# Patient Record
Sex: Female | Born: 1990 | Hispanic: Yes | Marital: Married | Smoking: Never smoker
Health system: Southern US, Community
[De-identification: ages and names within clinical notes are randomized; demographics above are authoritative.]

## PROBLEM LIST (undated history)

## (undated) HISTORY — PX: OTHER SURGICAL HISTORY: SHX169

---

## 2009-09-23 ENCOUNTER — Emergency Department: Payer: Self-pay | Admitting: Emergency Medicine

## 2010-04-24 ENCOUNTER — Emergency Department: Payer: Self-pay | Admitting: Emergency Medicine

## 2010-05-04 ENCOUNTER — Inpatient Hospital Stay: Payer: Self-pay | Admitting: Obstetrics and Gynecology

## 2011-03-06 ENCOUNTER — Emergency Department: Payer: Self-pay | Admitting: Unknown Physician Specialty

## 2011-05-26 ENCOUNTER — Emergency Department: Payer: Self-pay | Admitting: *Deleted

## 2011-05-26 LAB — URINALYSIS, COMPLETE
Bilirubin,UR: NEGATIVE
Blood: NEGATIVE
Ketone: NEGATIVE
Ph: 5 (ref 4.5–8.0)
Protein: NEGATIVE
RBC,UR: 2 /HPF (ref 0–5)
Specific Gravity: 1.017 (ref 1.003–1.030)
Squamous Epithelial: 7

## 2011-05-26 LAB — CBC
HCT: 42.4 % (ref 35.0–47.0)
HGB: 14.5 g/dL (ref 12.0–16.0)
MCH: 28.9 pg (ref 26.0–34.0)
MCHC: 34.1 g/dL (ref 32.0–36.0)
RDW: 13.8 % (ref 11.5–14.5)
WBC: 11.5 10*3/uL — ABNORMAL HIGH (ref 3.6–11.0)

## 2011-05-26 LAB — PREGNANCY, URINE: Pregnancy Test, Urine: NEGATIVE m[IU]/mL

## 2011-05-26 LAB — COMPREHENSIVE METABOLIC PANEL
Albumin: 3.9 g/dL (ref 3.4–5.0)
Anion Gap: 10 (ref 7–16)
BUN: 8 mg/dL (ref 7–18)
Creatinine: 0.73 mg/dL (ref 0.60–1.30)
Glucose: 136 mg/dL — ABNORMAL HIGH (ref 65–99)
Osmolality: 282 (ref 275–301)
Potassium: 3.5 mmol/L (ref 3.5–5.1)
Sodium: 141 mmol/L (ref 136–145)
Total Protein: 8 g/dL (ref 6.4–8.2)

## 2011-06-04 ENCOUNTER — Inpatient Hospital Stay: Payer: Self-pay | Admitting: Surgery

## 2011-06-04 LAB — COMPREHENSIVE METABOLIC PANEL
Albumin: 4 g/dL (ref 3.4–5.0)
Anion Gap: 11 (ref 7–16)
BUN: 16 mg/dL (ref 7–18)
Creatinine: 0.62 mg/dL (ref 0.60–1.30)
EGFR (Non-African Amer.): >60
Glucose: 85 mg/dL (ref 65–99)
Osmolality: 287 (ref 275–301)
Potassium: 3.6 mmol/L (ref 3.5–5.1)
Sodium: 144 mmol/L (ref 136–145)
Total Protein: 8.4 g/dL — ABNORMAL HIGH (ref 6.4–8.2)

## 2011-06-04 LAB — URINALYSIS, COMPLETE
Bilirubin,UR: NEGATIVE
Blood: NEGATIVE
Ketone: NEGATIVE
Nitrite: NEGATIVE
Ph: 5 (ref 4.5–8.0)
Protein: NEGATIVE
Specific Gravity: 1.024 (ref 1.003–1.030)
WBC UR: 5 /HPF (ref 0–5)

## 2011-06-04 LAB — PREGNANCY, URINE: Pregnancy Test, Urine: NEGATIVE m[IU]/mL

## 2011-06-04 LAB — MAGNESIUM: Magnesium: 2 mg/dL

## 2011-06-04 LAB — CBC
HCT: 42.8 % (ref 35.0–47.0)
HGB: 14.5 g/dL (ref 12.0–16.0)
MCH: 29.1 pg (ref 26.0–34.0)
MCHC: 33.9 g/dL (ref 32.0–36.0)
MCV: 86 fL (ref 80–100)
RDW: 13.5 % (ref 11.5–14.5)

## 2011-06-06 LAB — BASIC METABOLIC PANEL
Anion Gap: 10 (ref 7–16)
Calcium, Total: 8.4 mg/dL — ABNORMAL LOW (ref 8.5–10.1)
Co2: 26 mmol/L (ref 21–32)
Creatinine: 0.62 mg/dL (ref 0.60–1.30)
EGFR (African American): >60
EGFR (Non-African Amer.): >60
Glucose: 102 mg/dL — ABNORMAL HIGH (ref 65–99)
Sodium: 142 mmol/L (ref 136–145)

## 2011-06-06 LAB — CBC WITH DIFFERENTIAL/PLATELET
Basophil #: 0 10*3/uL (ref 0.0–0.1)
Basophil %: 0.4 %
Eosinophil #: 0.5 10*3/uL (ref 0.0–0.7)
Eosinophil %: 8.4 %
HGB: 12.4 g/dL (ref 12.0–16.0)
Lymphocyte #: 1.1 10*3/uL (ref 1.0–3.6)
MCH: 28.8 pg (ref 26.0–34.0)
MCHC: 33.6 g/dL (ref 32.0–36.0)
MCV: 86 fL (ref 80–100)
Monocyte #: 0.4 10*3/uL (ref 0.0–0.7)
Monocyte %: 6.6 %
Neutrophil %: 66.8 %
Platelet: 242 10*3/uL (ref 150–440)
RBC: 4.3 10*6/uL (ref 3.80–5.20)
WBC: 6.2 10*3/uL (ref 3.6–11.0)

## 2011-06-06 LAB — HEPATIC FUNCTION PANEL A (ARMC)
Alkaline Phosphatase: 126 U/L (ref 50–136)
Bilirubin,Total: 0.6 mg/dL (ref 0.2–1.0)
SGPT (ALT): 393 U/L — ABNORMAL HIGH
Total Protein: 6.9 g/dL (ref 6.4–8.2)

## 2012-08-09 ENCOUNTER — Encounter: Payer: Self-pay | Admitting: Maternal and Fetal Medicine

## 2012-08-22 ENCOUNTER — Emergency Department: Payer: Self-pay | Admitting: Emergency Medicine

## 2012-08-22 LAB — URINALYSIS, COMPLETE
Bilirubin,UR: NEGATIVE
Ketone: NEGATIVE
Nitrite: NEGATIVE
Ph: 5 (ref 4.5–8.0)
RBC,UR: 4 /HPF (ref 0–5)
WBC UR: 24 /HPF (ref 0–5)

## 2012-08-22 LAB — COMPREHENSIVE METABOLIC PANEL
Alkaline Phosphatase: 89 U/L (ref 50–136)
Bilirubin,Total: 0.3 mg/dL (ref 0.2–1.0)
Calcium, Total: 8.1 mg/dL — ABNORMAL LOW (ref 8.5–10.1)
Chloride: 106 mmol/L (ref 98–107)
Co2: 24 mmol/L (ref 21–32)
Glucose: 90 mg/dL (ref 65–99)
Osmolality: 275 (ref 275–301)
SGOT(AST): 21 U/L (ref 15–37)
Sodium: 139 mmol/L (ref 136–145)
Total Protein: 7.4 g/dL (ref 6.4–8.2)

## 2012-08-22 LAB — CBC
HCT: 37.3 % (ref 35.0–47.0)
HGB: 12.8 g/dL (ref 12.0–16.0)
MCH: 28.6 pg (ref 26.0–34.0)
WBC: 8.7 10*3/uL (ref 3.6–11.0)

## 2012-08-22 LAB — LIPASE, BLOOD: Lipase: 93 U/L (ref 73–393)

## 2012-08-22 LAB — HCG, QUANTITATIVE, PREGNANCY: Beta Hcg, Quant.: 14845 m[IU]/mL — ABNORMAL HIGH

## 2012-08-23 ENCOUNTER — Encounter: Payer: Self-pay | Admitting: Obstetrics and Gynecology

## 2012-08-24 LAB — URINE CULTURE

## 2012-11-03 IMAGING — CR DG CHOLANGIOGRAM OPERATIVE
2 series · 11 of 11 positions shown · non-contrast
Comparison: none

REASON FOR EXAM: cholelithiasis
COMMENTS:

PROCEDURE:     DXR - DXR CHOLANGIOGRAM OP (INITIAL)  - June 05, 2011  [DATE]
RESULT:     Contrast is visualized in the hepatic ducts and common duct. No
retained stone is seen. Contrast is noted to flow into the duodenum without
evidence of obstruction.

[Series 4: cont. · 10 of 128 frames shown]
[frame 1/128]
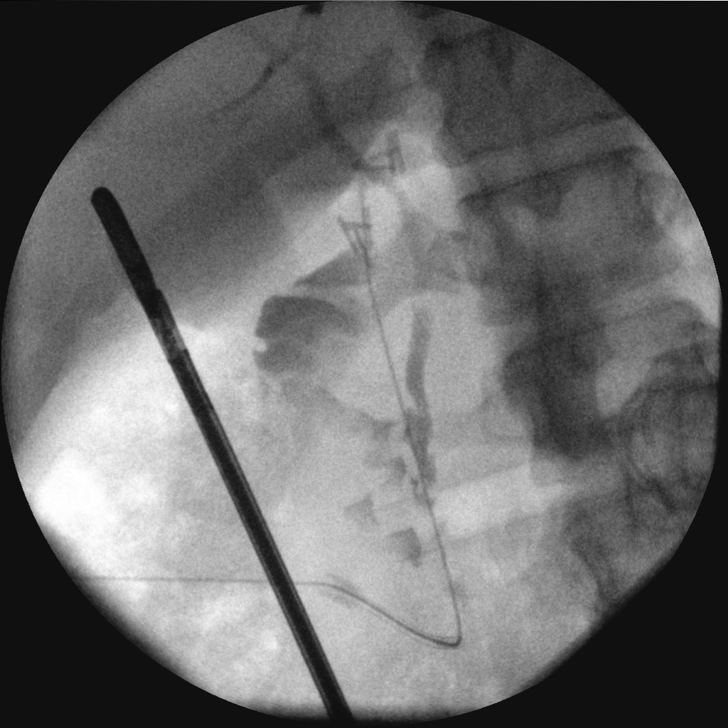
[frame 15/128]
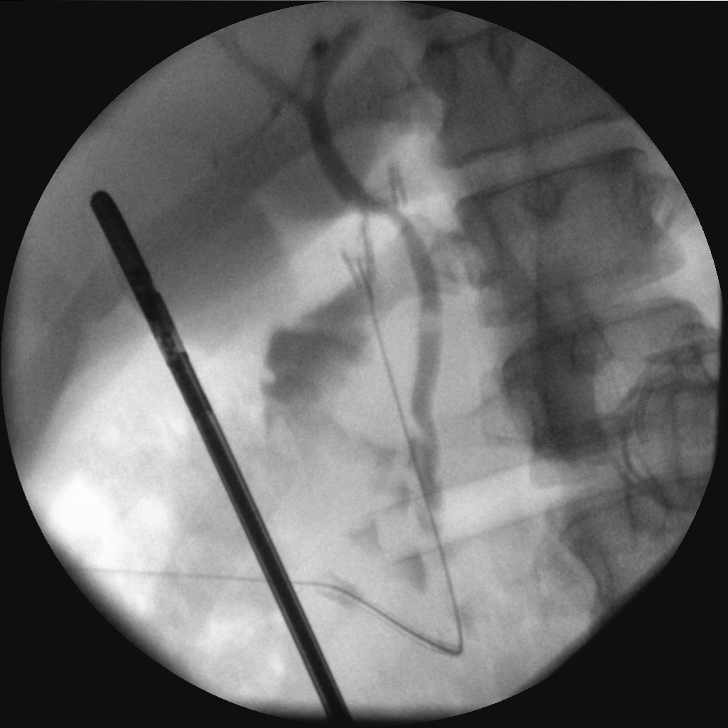
[frame 29/128]
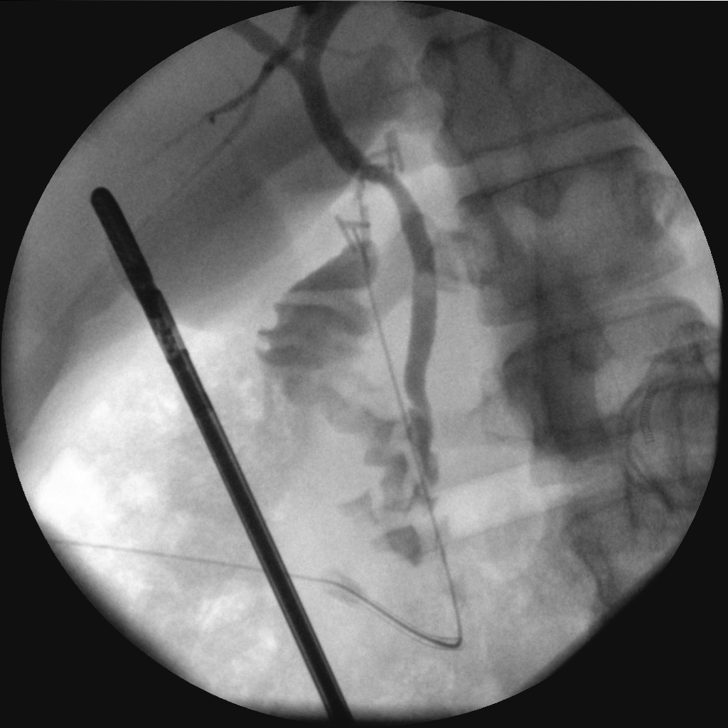
[frame 43/128]
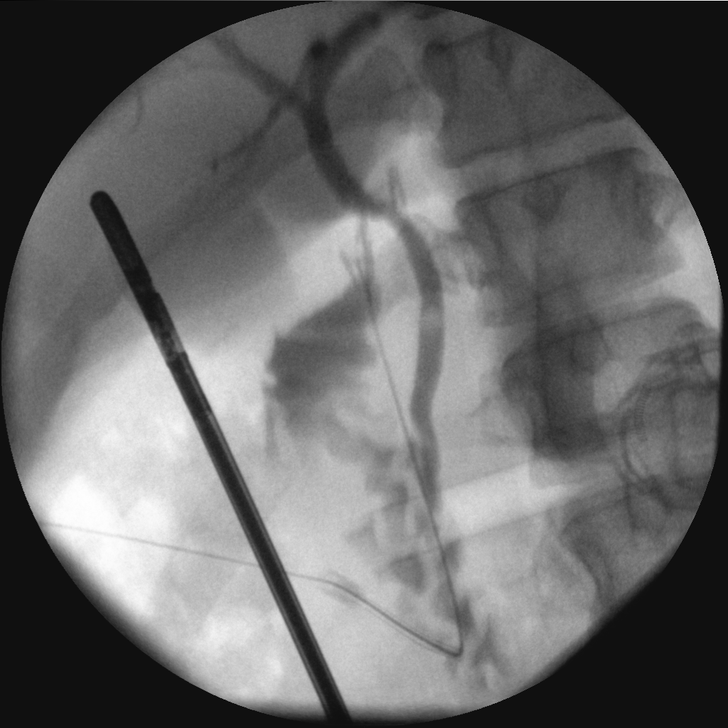
[frame 57/128]
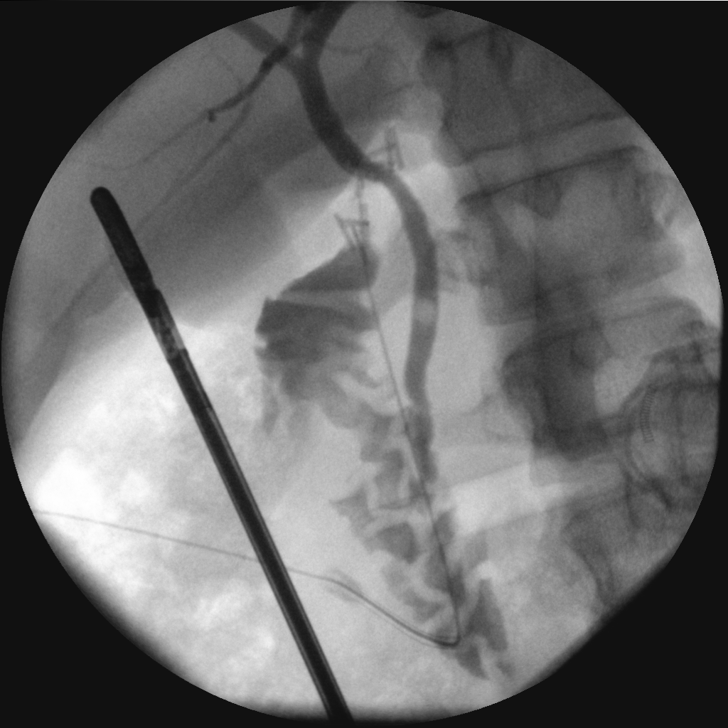
[frame 71/128]
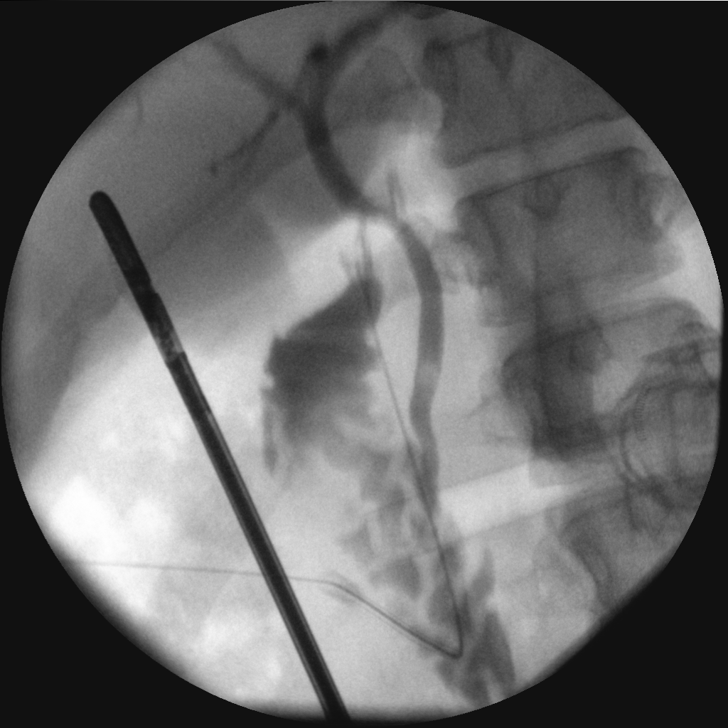
[frame 85/128]
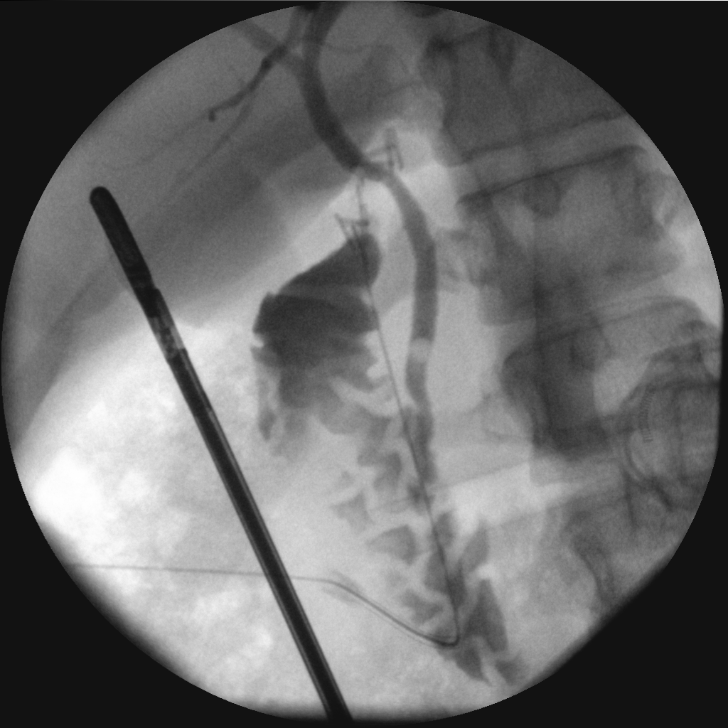
[frame 99/128]
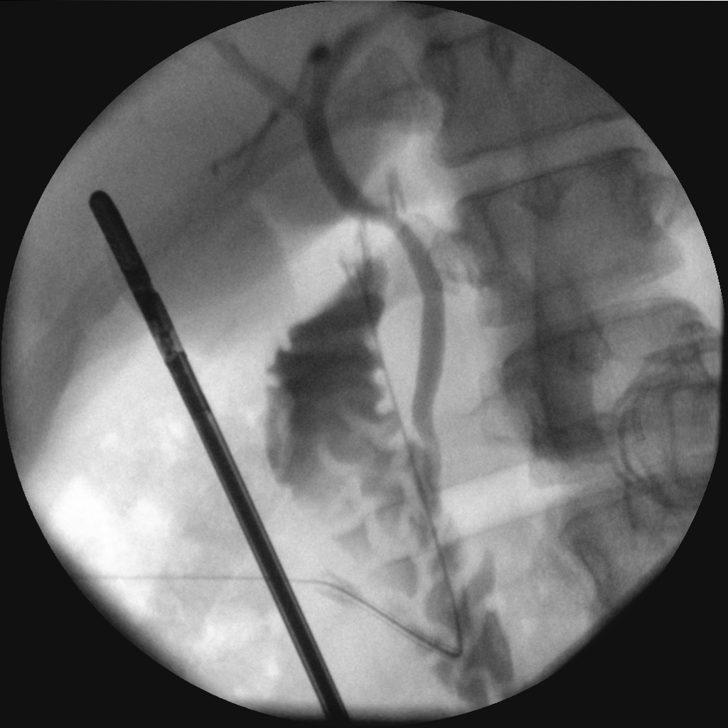
[frame 113/128]
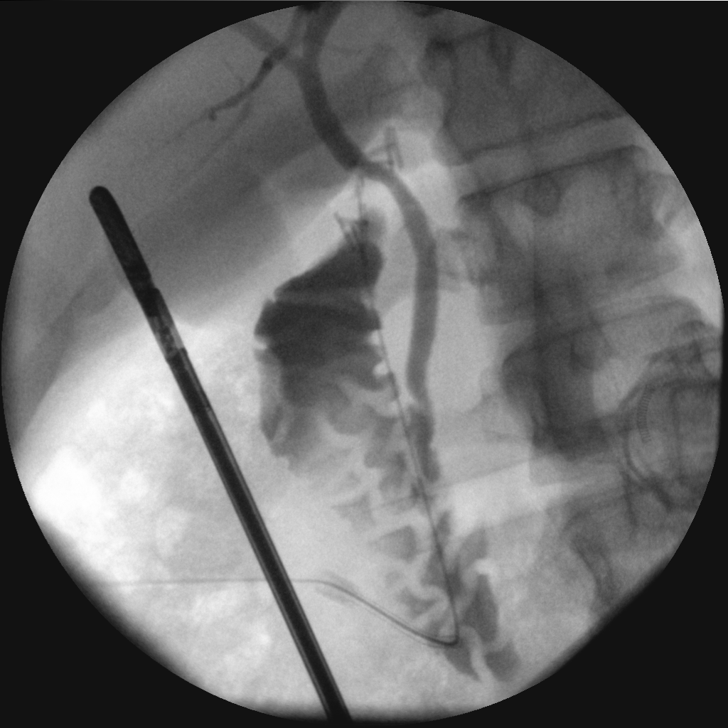
[frame 128/128]
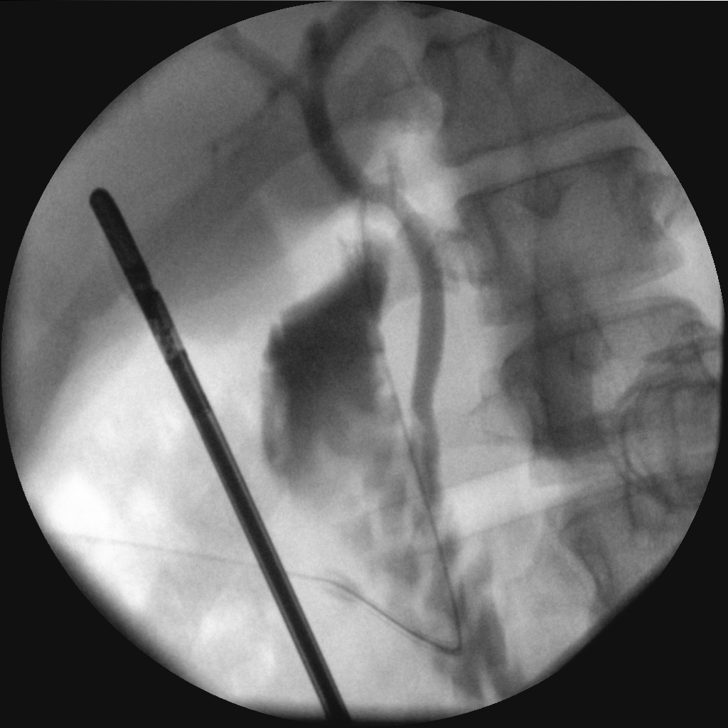

[chole]
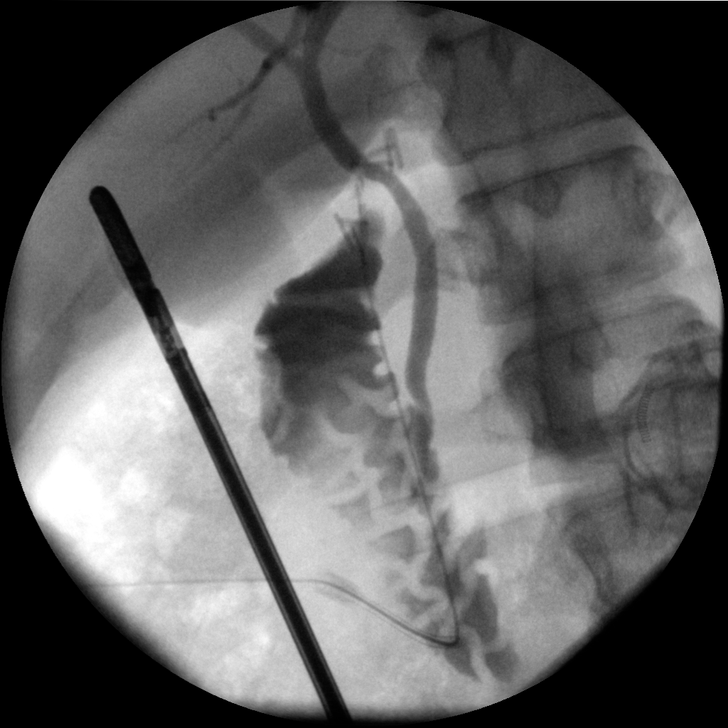

[11 of 11 positions shown; findings below may reference images not displayed]

IMPRESSION: Normal operative cholangiogram.

## 2012-11-24 ENCOUNTER — Ambulatory Visit: Payer: Self-pay | Admitting: Family Medicine

## 2013-01-13 ENCOUNTER — Observation Stay: Payer: Self-pay | Admitting: Obstetrics and Gynecology

## 2013-01-16 ENCOUNTER — Inpatient Hospital Stay: Payer: Self-pay

## 2013-01-16 LAB — CBC WITH DIFFERENTIAL/PLATELET
Basophil #: 0.1 10*3/uL (ref 0.0–0.1)
Basophil %: 0.6 %
Eosinophil %: 1.3 %
HCT: 36.9 % (ref 35.0–47.0)
HGB: 12.3 g/dL (ref 12.0–16.0)
Lymphocyte #: 1.7 10*3/uL (ref 1.0–3.6)
Lymphocyte %: 15.7 %
MCHC: 33.4 g/dL (ref 32.0–36.0)
MCV: 79 fL — ABNORMAL LOW (ref 80–100)
Monocyte #: 0.8 x10 3/mm (ref 0.2–0.9)
Monocyte %: 7.3 %
Neutrophil #: 8.4 10*3/uL — ABNORMAL HIGH (ref 1.4–6.5)
Neutrophil %: 75.1 %
RBC: 4.68 10*6/uL (ref 3.80–5.20)
RDW: 16 % — ABNORMAL HIGH (ref 11.5–14.5)
WBC: 11.1 10*3/uL — ABNORMAL HIGH (ref 3.6–11.0)

## 2013-01-17 LAB — PROTEIN / CREATININE RATIO, URINE: Creatinine, Urine: 217.4 mg/dL — ABNORMAL HIGH (ref 30.0–125.0)

## 2014-04-25 IMAGING — US US OB LIMITED
1 series · 14 of 28 positions shown · non-contrast
Comparison: none

REASON FOR EXAM: presentation
COMMENTS:

[Series 1: us ob limited · 0.35mm/px · 14 of 30 slices shown]
[im 2/30]
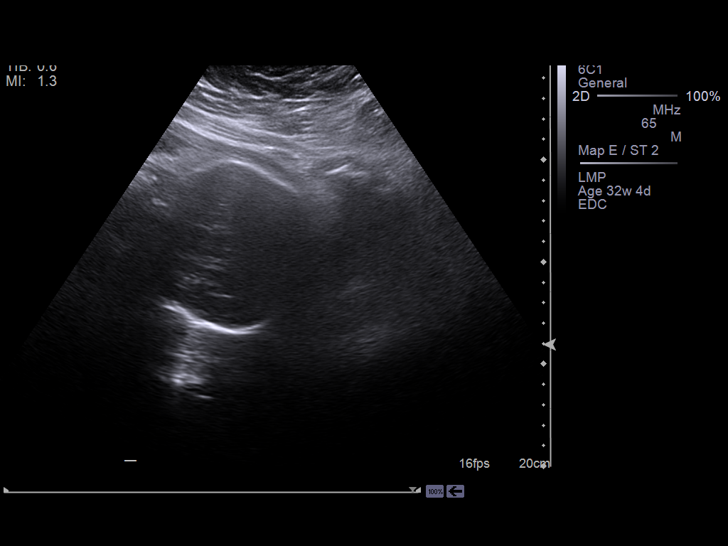
[im 4/30]
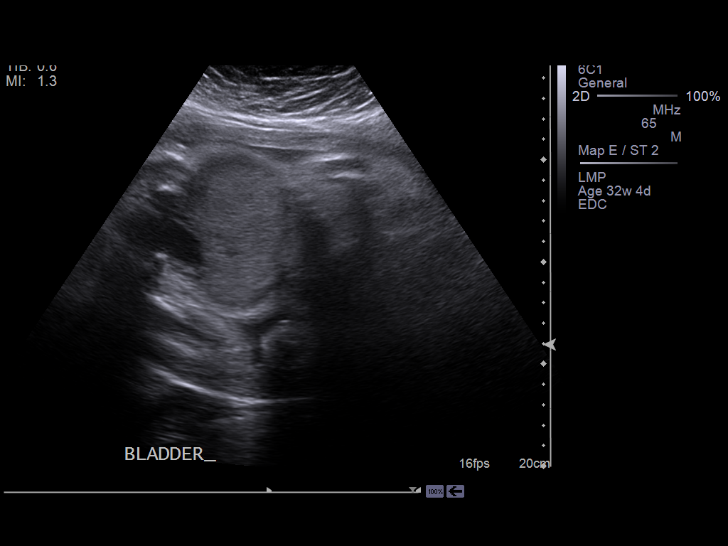
[im 6/30]
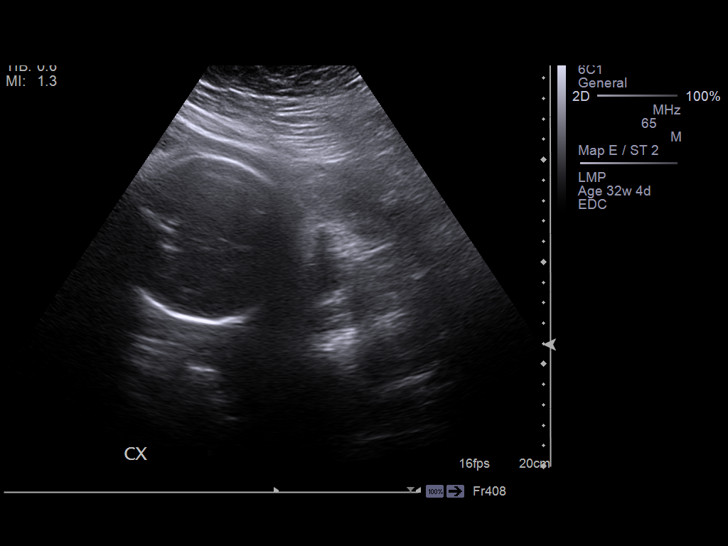
[im 8/30]
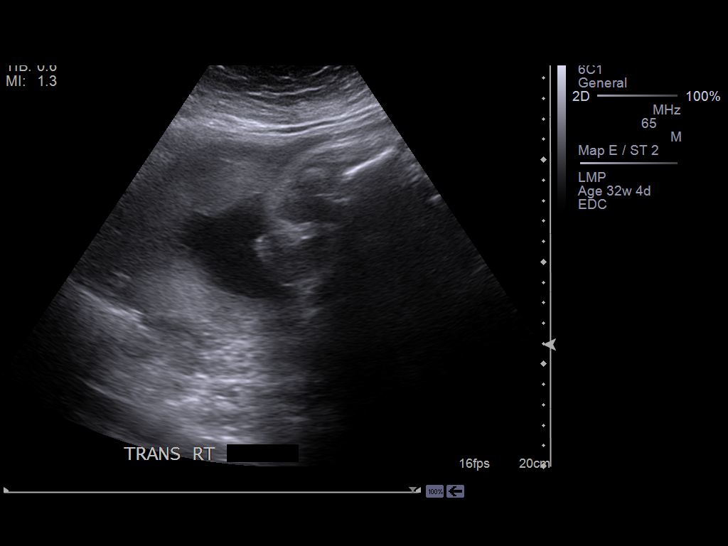
[im 10/30]
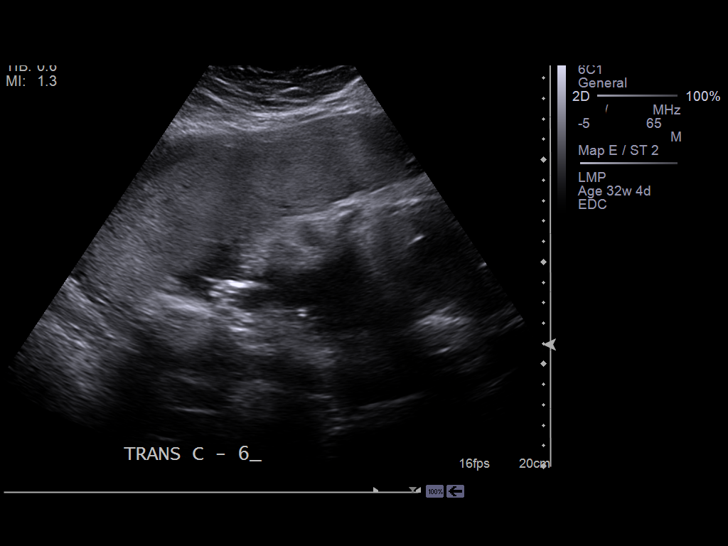
[im 12/30]
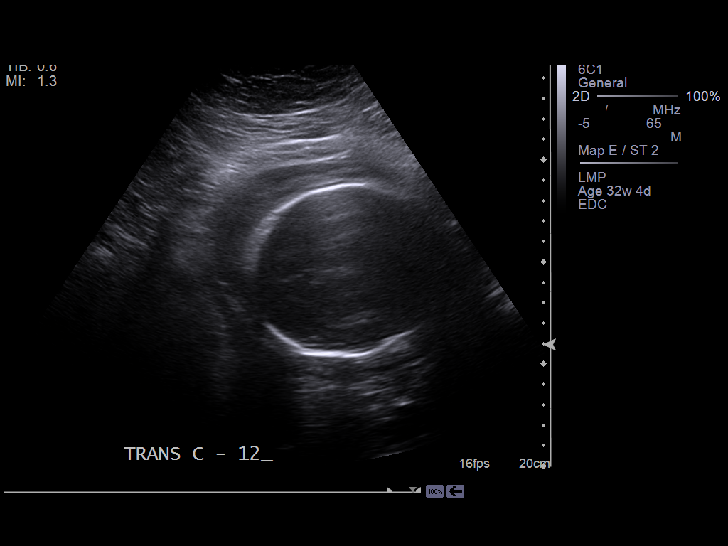
[im 14/30]
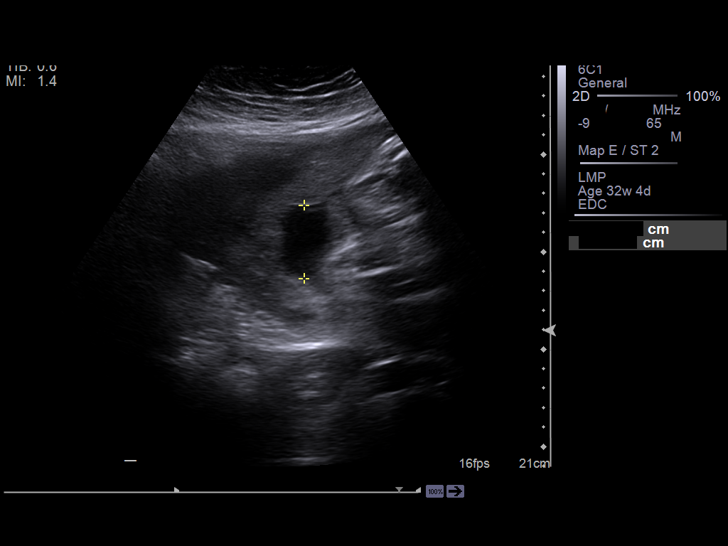
[im 17/30]
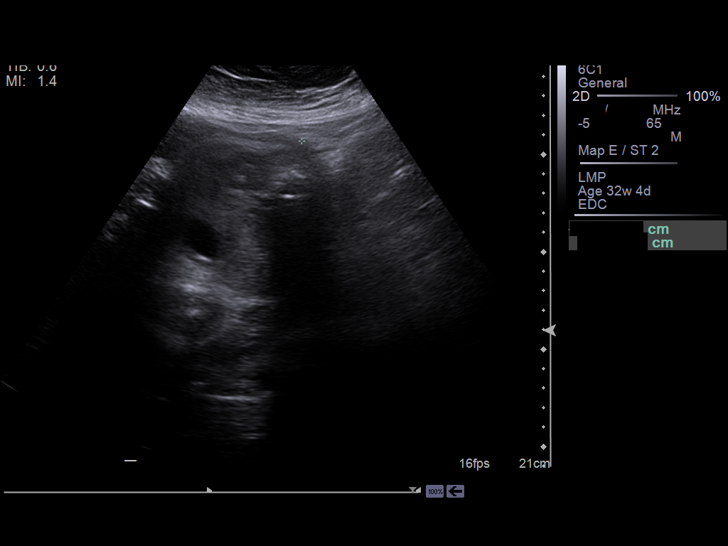
[im 19/30]
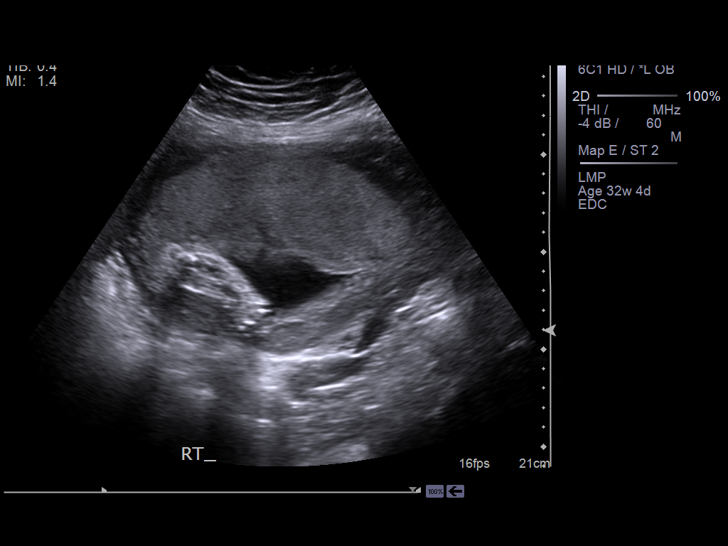
[im 21/30]
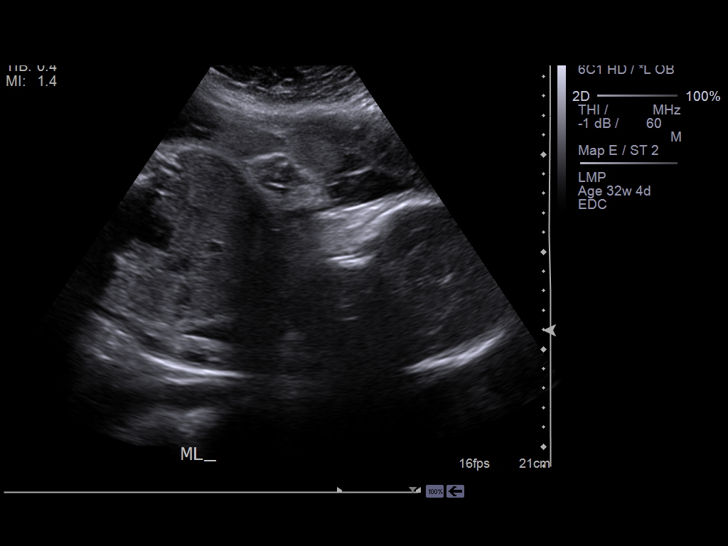
[im 23/30]
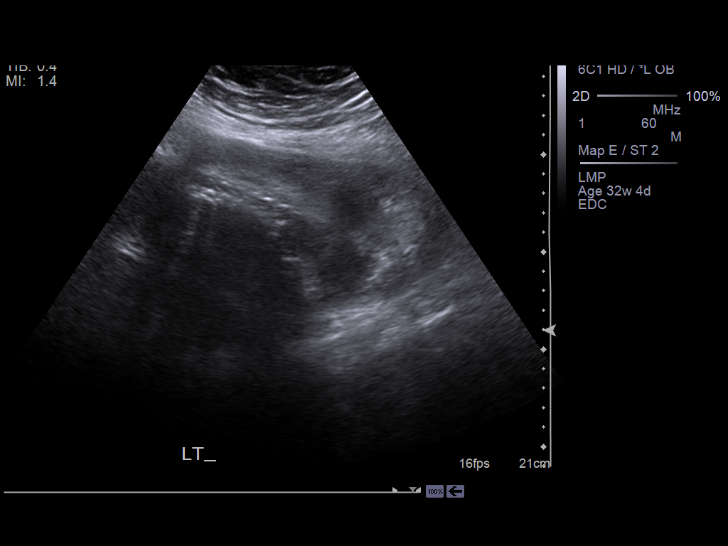
[im 25/30]
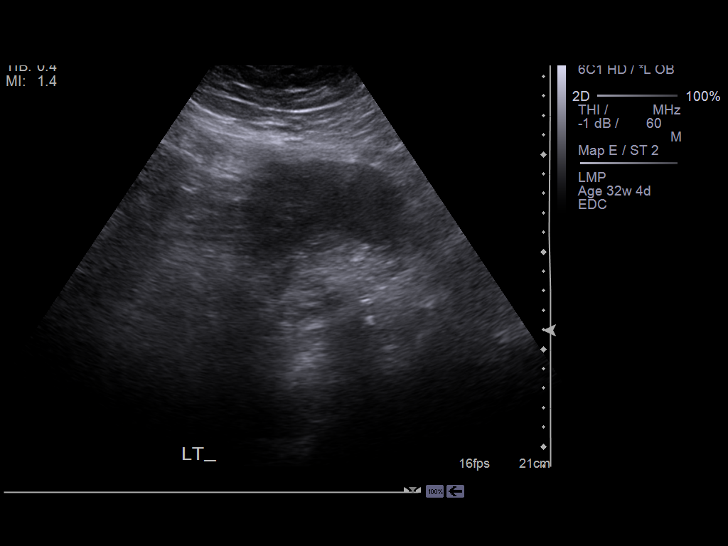
[im 27/30]
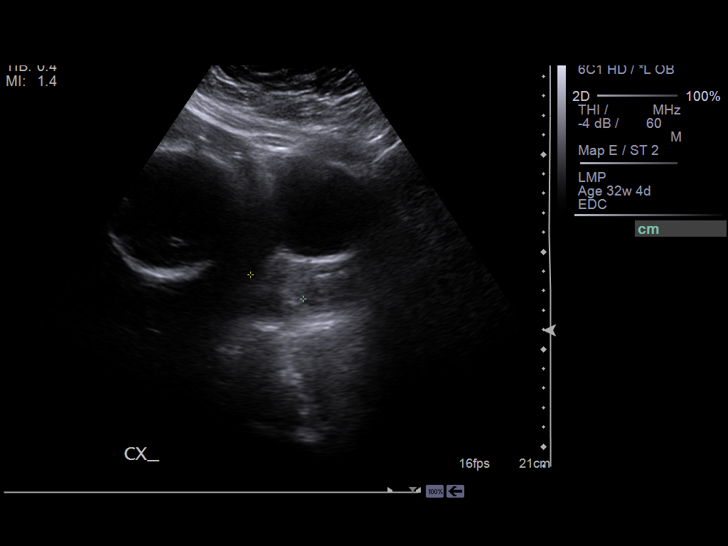
[im 30/30]
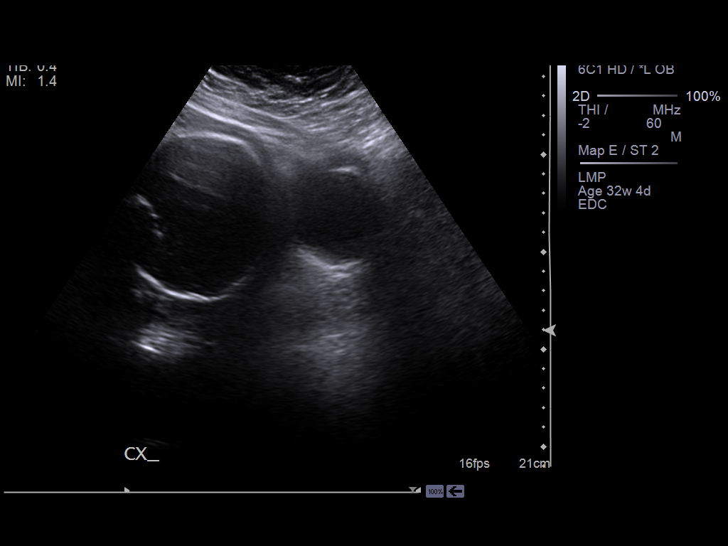

[14 of 28 positions shown; findings below may reference images not displayed]

PROCEDURE:     US  - US LIMITED OB  - November 24, 2012  [DATE]

RESULT:     Limited OB ultrasound is demonstrates a single intrauterine
gestation in a cephalic presentation with an AFI of 12.9 cm. The placenta is
fundal until the right. The cervix is closed with a length of 4.3 cm
measured. Fetal cardiac activity is present with a heart rate of 128 beats
per minute. Distance from the lower margin of the placenta to the internal
cervical os is 20.0 cm.
IMPRESSION: 1. Single intrauterine gestation in a cephalic presentation as described.
AFI is 12.9 cm.

[REDACTED]

## 2014-08-20 NOTE — H&P (Signed)
PATIENT NAME:  Erika Maxwell, Erika Maxwell MR#:  [id number] DATE OF BIRTH:  1990-12-15  DATE OF ADMISSION:  06/04/2011  PRIMARY CARE PHYSICIAN: None.   ADMITTING PHYSICIAN: Dr. Michela PitcherEly   CHIEF COMPLAINT: Abdominal pain.   BRIEF HISTORY: [name] is a 24 year old woman seen in the Emergency Room for the third time in the last six months with midepigastric, right upper quadrant, right back, and right shoulder pain. Work-up on two previous occasions has identified biliary tract disease as the source of her discomfort. She has had two ultrasounds both of which demonstrated multiple stones without evidence of cholecystitis. She was seen in our office by Dr. Excell Seltzerooper but she declined surgical intervention at the time of that evaluation liking to try nonsurgical techniques for improvement of her symptoms. This afternoon she developed severe pain in the midepigastric area which radiated through to her back and this is the worst episode that she has had since the onset of these symptoms over six months ago. She was last here the end of January and was set up to see Dr. Excell Seltzerooper again in the office next week.   These symptoms are associated with profound nausea, multiple episodes of vomiting with no fever or chills. She has no change in her bowel habits. She denies any other significant history of GI problems including hepatitis, yellow jaundice, pancreatitis, peptic ulcer disease, previous diagnosis of gallbladder disease, or diverticulitis. She has no history of abdominal surgery. She is a year from vaginal delivery with a tear for a normal child. She did not have any complications from that injury. She denies any history of cardiac disease, hypertension, or diabetes.   SOCIAL HISTORY: She does not smoke or drink significant alcohol.   REVIEW OF SYSTEMS: 10 point review of systems was undertaken with the patient and is unremarkable. The interview was carried out through the hospital interpreter.   PHYSICAL  EXAMINATION:   VITALS: Blood pressure 118/63, heart rate 82 and regular, temperature is 98.4.   HEENT: No scleral icterus. Pupils were equally round and there is no evidence of facial deformity.   NECK: Supple without adenopathy and her trachea is midline.   CHEST: Clear with no adventitious sounds and she has normal pulmonary excursion.   CARDIAC: No murmurs or gallops to my ear and seems to be in normal sinus rhythm.   ABDOMEN: Her abdomen is soft with no abdominal tenderness at this time. She has received IV narcotics. She has no point tenderness. No rebound. No guarding. No masses. No hernias noted. She does have active bowel sounds.   LOWER EXTREMITIES: Full range of motion. No deformities. Good distal pulses.   PSYCHIATRIC: Normal orientation and normal affect.   LABORATORY, DIAGNOSTIC, AND RADIOLOGICAL DATA: Blood work on this admission reveals normal white blood cell count and normal lipase. Liver function studies reveal bilirubin is elevated to 1.8 where it was 1.9 on a previous evaluation. Alkaline phosphatase is slightly elevated at 138. SGPT and SGOT are markedly elevated over their previous numbers. We have not repeated an ultrasound at this time.   IMPRESSION: This woman appears to present with biliary colic, possible cholecystitis. This is her third episode and she is significantly uncomfortable. Will admit her to the hospital. Place her on IV antibiotics and pain control medications. Will anticipate surgical intervention tomorrow. This plan has been outlined to the patient through the hospital interpreter and she is in agreement. Risks, benefits, and options have been outlined and accepted.   ____________________________ Quentin Orealph L. Ely  III, MD rle:drc D: 06/04/2011 23:48:46 ET T: 06/05/2011 06:35:47 ET JOB#: [id number]  cc: Quentin Ore III, MD, <Dictator> Quentin Ore MD ELECTRONICALLY SIGNED 06/08/2011 21:14

## 2014-08-20 NOTE — Op Note (Signed)
PATIENT NAME:  Erika Maxwell Erika Maxwell, Erika Maxwell MR#:  [id number] DATE OF BIRTH:  1991-03-09  DATE OF PROCEDURE:  06/05/2011  PREOPERATIVE DIAGNOSIS: Choledocholithiasis.   POSTOPERATIVE DIAGNOSIS: Choledocholithiasis.   PROCEDURE: Laparoscopic cholecystectomy with C-arm fluoroscopic cholangiography.   SURGEON: Tanmay Halteman E. Excell Seltzerooper, MD   ANESTHESIA: General with endotracheal tube.   INDICATIONS: This is a patient with elevated liver function tests and known gallstones with a work-up showing a probable choledocholithiasis. Preoperatively we discussed rationale for surgery, the options of observation, risks of bleeding, infection, recurrence, and cosmetic deformity as well as risk of opening to a laparotomy and the risk of bile duct damage, bile duct leak, retained common bile duct stone, any of which could require further surgery and/or ERCP, stent, and papillotomy. This has been reviewed for her personally in my office several months ago as well.   FINDINGS: C-arm fluoroscopic cholangiography demonstrated good flow in the duodenum. There was initially an air bubble that moved and disappeared, went through into the duodenum with fluoroscopy. No other intraluminal filling defects. Proximal ducts were identified and the cystic duct had been cannulated.   DESCRIPTION OF PROCEDURE: The patient was induced to general anesthesia, given IV antibiotics, prepped and draped in a sterile fashion. VTE prophylaxis was in place.   An incision was made after placing local anesthetic in the periumbilical area. A Veress needle was placed. Pneumoperitoneum was obtained. 5 mm trocar port was placed. The abdominal cavity was explored and under direct vision a 10 mm epigastric port and two lateral 5 mm ports were placed. The gallbladder was placed on tension. The peritoneum over the infundibulum was incised bluntly. The cystic duct gallbladder junction was well identified. Cystic lymphatics were doubly clipped and divided to allow  for visualization of the cystic duct. It was clipped and incised and through a separate incision an Angiocath cholangiogram catheter was placed. C-arm fluoroscopic cholangiography was performed and the above was noted. The cholangiogram catheter was then removed. Cystic duct was doubly clipped and divided and the cystic artery was doubly clipped and divided in two branches and then the gallbladder was taken from the gallbladder fossa with electrocautery. An arterial bleeder in the gallbladder fossa was doubly clipped and divided as well and the gallbladder was passed out through the epigastric port site with the aid of an EndoCatch bag. The area was checked for hemostasis and there was no sign of bleeding, bile leak or bowel injury. The camera was placed in the epigastric site to view back to the periumbilical site. There was no sign of bleeding, bowel injury, or adhesions; therefore, pneumoperitoneum was released. All ports were removed. Fascial edges at the epigastric site were approximated with multiple figure-of-eight 0 Vicryls and 4-0 subcuticular Monocryl was used on all skin edges. Steri-Strips, Mastisol, and sterile dressings were placed.   The patient tolerated the procedure well. There were no complications. She was taken to the recovery room in stable condition to be admitted for continued care.    ____________________________ Adah Salvageichard E. Excell Seltzerooper, MD rec:drc D: 06/05/2011 09:41:39 ET T: 06/05/2011 09:48:08 ET JOB#: [id number]  cc: Adah Salvageichard E. Excell Seltzerooper, MD, <Dictator> Lattie HawICHARD E Avantae Bither MD ELECTRONICALLY SIGNED 06/05/2011 13:38

## 2014-09-05 NOTE — H&P (Signed)
L&D Evaluation:  History:  HPI 24 y/o G2P1 here for post-dates induction   Patient's Medical History No Chronic Illness   Patient's Surgical History none   Medications Pre Natal Vitamins   Allergies NKDA   Social History none   Family History Non-Contributory   ROS:  ROS All systems were reviewed.  HEENT, CNS, GI, GU, Respiratory, CV, Renal and Musculoskeletal systems were found to be normal.   Exam:  Vital Signs stable   General no apparent distress   Abdomen gravid, tender with contractions   Back no CVAT   Reflexes 1+   Pelvic now 8cm   FHT normal rate with no decels   Ucx regular   Impression:  Impression active labor, cervidil pulled at 4am   Plan:  Comments epidural placed antic svd   Electronic Signatures: Margaretha GlassingEvans, Ricky L (MD)  (Signed 22-Sep-14 06:41)  Authored: L&D Evaluation   Last Updated: 22-Sep-14 06:41 by Margaretha GlassingEvans, Ricky L (MD)

## 2014-09-05 NOTE — H&P (Signed)
L&D Evaluation:  History:  HPI [age] y/o G2P1 at 39+ weeks EDC 01/15/13 Pregnancy complicated by obesity and + Down's risk on AFP   Presents with for NST   Exam:  FHT normal rate with no decels   Ucx regular   Plan:  Plan reactive NST   Comments Induction scheduled for 9/21   Electronic Signatures: Margaretha GlassingEvans, Ricky L (MD)  (Signed 18-Sep-14 09:27)  Authored: L&D Evaluation   Last Updated: 18-Sep-14 09:27 by Margaretha GlassingEvans, Ricky L (MD)

## 2015-07-09 LAB — HM PAP SMEAR: HM Pap smear: NEGATIVE

## 2015-11-12 ENCOUNTER — Other Ambulatory Visit: Payer: Self-pay | Admitting: Family Medicine

## 2015-11-12 DIAGNOSIS — Z3401 Encounter for supervision of normal first pregnancy, first trimester: Secondary | ICD-10-CM

## 2015-11-12 DIAGNOSIS — Z369 Encounter for antenatal screening, unspecified: Secondary | ICD-10-CM

## 2015-11-12 DIAGNOSIS — O99210 Obesity complicating pregnancy, unspecified trimester: Secondary | ICD-10-CM

## 2015-11-20 ENCOUNTER — Ambulatory Visit
Admission: RE | Admit: 2015-11-20 | Discharge: 2015-11-20 | Disposition: A | Discharge status: Home / Self Care | Payer: Self-pay | Source: Ambulatory Visit | Attending: Family Medicine | Admitting: Family Medicine

## 2015-11-20 DIAGNOSIS — Z3401 Encounter for supervision of normal first pregnancy, first trimester: Secondary | ICD-10-CM

## 2015-11-20 DIAGNOSIS — Z3A10 10 weeks gestation of pregnancy: Secondary | ICD-10-CM | POA: Insufficient documentation

## 2015-11-20 DIAGNOSIS — N83201 Unspecified ovarian cyst, right side: Secondary | ICD-10-CM | POA: Insufficient documentation

## 2015-11-20 DIAGNOSIS — O99211 Obesity complicating pregnancy, first trimester: Secondary | ICD-10-CM | POA: Insufficient documentation

## 2015-11-20 DIAGNOSIS — O99210 Obesity complicating pregnancy, unspecified trimester: Secondary | ICD-10-CM

## 2015-12-10 ENCOUNTER — Ambulatory Visit (HOSPITAL_BASED_OUTPATIENT_CLINIC_OR_DEPARTMENT_OTHER)
Admission: RE | Admit: 2015-12-10 | Discharge: 2015-12-10 | Disposition: A | Discharge status: Home / Self Care | Payer: Medicaid Other | Source: Ambulatory Visit | Attending: Maternal & Fetal Medicine | Admitting: Maternal & Fetal Medicine

## 2015-12-10 ENCOUNTER — Ambulatory Visit
Admission: RE | Admit: 2015-12-10 | Discharge: 2015-12-10 | Disposition: A | Discharge status: Home / Self Care | Payer: Medicaid Other | Source: Ambulatory Visit | Attending: Family Medicine | Admitting: Family Medicine

## 2015-12-10 DIAGNOSIS — Z369 Encounter for antenatal screening, unspecified: Secondary | ICD-10-CM

## 2015-12-10 DIAGNOSIS — N83291 Other ovarian cyst, right side: Secondary | ICD-10-CM | POA: Insufficient documentation

## 2015-12-10 DIAGNOSIS — Z36 Encounter for antenatal screening of mother: Secondary | ICD-10-CM | POA: Diagnosis not present

## 2015-12-10 DIAGNOSIS — Z3A12 12 weeks gestation of pregnancy: Secondary | ICD-10-CM | POA: Insufficient documentation

## 2015-12-10 DIAGNOSIS — Z3481 Encounter for supervision of other normal pregnancy, first trimester: Secondary | ICD-10-CM | POA: Insufficient documentation

## 2015-12-10 NOTE — Progress Notes (Signed)
[facility]  [facility] Length of Consultation: 45 minutes   Erika Maxwell  [facility] to review prenatal screening and testing options.  This note summarizes the information we discussed.    We offered the following routine screening tests for this pregnancy:  First trimester screening, which includes nuchal translucency ultrasound screen and first trimester maternal serum marker screening.  The nuchal translucency has approximately an 80% detection rate for Down syndrome and can be positive for other chromosome abnormalities as well as congenital heart defects.  When combined with a maternal serum marker screening, the detection rate is up to 90% for Down syndrome and up to 97% for trisomy 18.     Maternal serum marker screening, a blood test that measures pregnancy proteins, can provide risk assessments for Down syndrome, trisomy 18, and open neural tube defects (spina bifida, anencephaly). Because it does not directly examine the fetus, it cannot positively diagnose or rule out these problems.  Targeted ultrasound uses high frequency sound waves to create an image of the developing fetus.  An ultrasound is often recommended as a routine means of evaluating the pregnancy.  It is also used to screen for fetal anatomy problems (for example, a heart defect) that might be suggestive of a chromosomal or other abnormality.   Should these screening tests indicate an increased concern, then the following additional testing options would be offered:  The chorionic villus sampling procedure is available for first trimester chromosome analysis.  This involves the withdrawal of a small amount of chorionic villi (tissue from the developing placenta).  Risk of pregnancy loss is estimated to be approximately 1 in 200 to 1 in 100 (0.5 to 1%).  There is approximately a 1% (1 in 100) chance that the CVS chromosome results  will be unclear.  Chorionic villi cannot be tested for neural tube defects.     Amniocentesis involves the removal of a small amount of amniotic fluid from the sac surrounding the fetus with the use of a thin needle inserted through the maternal abdomen and uterus.  Ultrasound guidance is used throughout the procedure.  Fetal cells from amniotic fluid are directly evaluated and > 99.5% of chromosome problems and > 98% of open neural tube defects can be detected. This procedure is generally performed after the 15th week of pregnancy.  The main risks to this procedure include complications leading to miscarriage in less than 1 in 200 cases (0.5%).  As another option for information if the pregnancy is suspected to be an an increased chance for certain chromosome conditions, we also reviewed the availability of cell free fetal DNA testing from maternal blood to determine whether or not the baby may have either Down syndrome, trisomy 6613, or trisomy 6018.  This test utilizes a maternal blood sample and DNA sequencing technology to isolate circulating cell free fetal DNA from maternal plasma.  The fetal DNA can then be analyzed for DNA sequences that are derived from the three most common chromosomes involved in aneuploidy, chromosomes 13, 18, and 21.  If the overall amount of DNA is greater than the expected level for any of these chromosomes, aneuploidy is suspected.  While we do not consider it a replacement for invasive testing and karyotype analysis, a negative result from this testing would be reassuring, though not a guarantee of a normal chromosome complement for the baby.  An abnormal result is certainly suggestive of an abnormal chromosome  complement, though we would still recommend CVS or amniocentesis to confirm any findings from this testing.   Cystic Fibrosis and Spinal Muscular Atrophy (SMA) screening were also discussed with the patient. Both conditions are recessive, which means that both parents  must be carriers in order to have a child with the disease.  Cystic fibrosis (CF) is one of the most common genetic conditions in persons of Caucasian ancestry.  This condition occurs in approximately 1 in 2,500 Caucasian persons and results in thickened secretions in the lungs, digestive, and reproductive systems.  For a baby to be at risk for having CF, both of the parents must be carriers for this condition.  Approximately 1 in 5325 Caucasian persons is a carrier for CF.  Current carrier testing looks for the most common mutations in the gene for CF and can detect approximately 90% of carriers in the Caucasian population.  This means that the carrier screening can greatly reduce, but cannot eliminate, the chance for an individual to have a child with CF.  If an individual is found to be a carrier for CF, then carrier testing would be available for the partner. As part of [place] newborn screening profile, all babies born in the state of [place] will have a two-tier screening process.  Specimens are first tested to determine the concentration of immunoreactive trypsinogen (IRT).  The top 5% of specimens with the highest IRT values then undergo DNA testing using a panel of over 40 common CF mutations. SMA is a neurodegenerative disorder that leads to atrophy of skeletal muscle and overall weakness.  This condition is also more prevalent in the Caucasian population, with 1 in 40-1 in 60 persons being a carrier and 1 in 6,000-1 in 10,000 children being affected.  There are multiple forms of the disease, with some causing death in infancy to other forms with survival into adulthood.  The genetics of SMA is complex, but carrier screening can detect up to 95% of carriers in the Caucasian population.  Similar to CF, a negative result can greatly reduce, but cannot eliminate, the chance to have a child with SMA.  We obtained a detailed family history and pregnancy history.  The family history was reported  to be unremarkable for birth defects, mental retardation, recurrent pregnancy loss or known chromosome abnormalities.  [name] stated that this is her third pregnancy.  She and her husband have two healthy sons.  She reported no complications or exposures in this pregnancy that would be expected to increase the risk for birth defects.  After consideration of the options, Erika Maxwell elected to proceed with first trimester screening and to declined carrier testing for CF and SMA.  An ultrasound was performed at the time of the visit.  The gestational age was consistent with  12 weeks.  Fetal anatomy could not be assessed due to early gestational age.  Please refer to the ultrasound report for details of that study.  [name] was encouraged to call with questions or concerns.  We can be contacted at [phone].   Cherly Andersoneborah F. Roberth Berling, MS, CGC

## 2015-12-10 NOTE — Progress Notes (Signed)
Agree with assessment and plan as outlined in [facility] Wells's note.   

## 2015-12-13 ENCOUNTER — Telehealth: Payer: Self-pay | Admitting: Obstetrics and Gynecology

## 2015-12-13 NOTE — Telephone Encounter (Signed)
The results of the First Trimester Nuchal Translucency and Biochemical Screening are now available.  We spoke with [name] by phone with the aid of a Spanish interpreter.  These results showed an increased risk for Down Syndrome.  Specifically, the risk for Down syndrome is now 1 in 42186 (the prior risk based upon age alone was 1 in 50837).  The risk for Trisomy 13/18 is estimated to be 1 in 5,193.  As we discussed when we called with these results, if more definitive information is desired, we would offer chorionic villus sampling or amniocentesis.  Because we do not yet know the effectiveness of combined first and second trimester screening, we do not recommend a maternal serum screen to assess the chance for chromosome conditions.  However, if screening for neural tube defects is desired, maternal serum screening for AFP only can be performed between 15 and [redacted] weeks gestation.    We also reviewed the availability of cell free fetal DNA testing from maternal blood to determine whether or not the baby may have either Down syndrome, trisomy 6713, or trisomy 2718.  This test utilizes a maternal blood sample and DNA sequencing technology to isolate circulating cell free fetal DNA from maternal plasma.  The fetal DNA can then be analyzed for DNA sequences that are derived from the three most common chromosomes involved in aneuploidy, chromosomes 13, 18, and 21.  If the overall amount of DNA is greater than the expected level for any of these chromosomes, aneuploidy is suspected.  While we do not consider it a replacement for invasive testing and karyotype analysis, a negative result from this testing would be reassuring, though not a guarantee of a normal chromosome complement for the baby.  An abnormal result is certainly suggestive of an abnormal chromosome complement, though we would still recommend CVS or amniocentesis to confirm any findings from this testing.  The patient elected to return to our clinic  on Monday, September 18 and 9am for a detailed anatomy ultrasound and cell free fetal DNA testing.  She declined amniocentesis at this time.  The patient was encouraged to contact us with any questions.  We may be reached at [phone].  Erika Andersoneborah F. Neha Waight, MS, CGC

## 2016-01-10 ENCOUNTER — Other Ambulatory Visit: Payer: Self-pay

## 2016-01-10 DIAGNOSIS — Z3689 Encounter for other specified antenatal screening: Secondary | ICD-10-CM

## 2016-01-14 ENCOUNTER — Ambulatory Visit
Admission: RE | Admit: 2016-01-14 | Discharge: 2016-01-14 | Disposition: A | Discharge status: Home / Self Care | Payer: Medicaid Other | Source: Ambulatory Visit | Attending: Maternal and Fetal Medicine | Admitting: Maternal and Fetal Medicine

## 2016-01-14 DIAGNOSIS — Z36 Encounter for antenatal screening of mother: Secondary | ICD-10-CM | POA: Insufficient documentation

## 2016-01-14 DIAGNOSIS — O289 Unspecified abnormal findings on antenatal screening of mother: Secondary | ICD-10-CM

## 2016-01-14 DIAGNOSIS — Z369 Encounter for antenatal screening, unspecified: Secondary | ICD-10-CM

## 2016-01-14 DIAGNOSIS — Z3689 Encounter for other specified antenatal screening: Secondary | ICD-10-CM

## 2016-01-14 DIAGNOSIS — Z3A17 17 weeks gestation of pregnancy: Secondary | ICD-10-CM | POA: Insufficient documentation

## 2016-01-14 NOTE — Progress Notes (Signed)
I met with [name] today following her anatomy ultrasound to review the first trimester screening results and additional testing options.  With the aid of a Spanish interpreter, we reviewed the 1 in 186 chance for Down syndrome from those results as well as the options of ultrasound, amniocentesis and cell free DNA testing.  The patient desires ultrasound only and declined both cell free DNA testing and amniocentesis.  In addition, the PAPP-A level was low (0.5%ile) on first trimester screening.  Low PAPP-A has been associated with poor pregnancy outcomes including low birth weight, preterm delivery and preeclampsia.  We therefore recommend a follow up ultrasound for growth in the third trimester.  The patient also spoke with Dr. Ricardo Jericho regarding the results of her ultrasound and the anatomy that was suboptimally visualized today.  She will return to clinic for additional ultrasound imaging in 2-3 weeks.  Wilburt Finlay, MS, CGC

## 2016-02-04 ENCOUNTER — Ambulatory Visit
Admission: RE | Admit: 2016-02-04 | Discharge: 2016-02-04 | Disposition: A | Discharge status: Home / Self Care | Payer: Medicaid Other | Source: Ambulatory Visit | Attending: Maternal & Fetal Medicine | Admitting: Maternal & Fetal Medicine

## 2016-02-04 ENCOUNTER — Other Ambulatory Visit: Payer: Self-pay | Admitting: Maternal & Fetal Medicine

## 2016-02-04 VITALS — BP 131/69 | HR 88 | Temp 98.4°F | Resp 17 | Ht 64.0 in | Wt 278.0 lb

## 2016-02-04 DIAGNOSIS — IMO0002 Reserved for concepts with insufficient information to code with codable children: Secondary | ICD-10-CM

## 2016-02-04 DIAGNOSIS — O99213 Obesity complicating pregnancy, third trimester: Secondary | ICD-10-CM

## 2016-02-04 DIAGNOSIS — Z0489 Encounter for examination and observation for other specified reasons: Secondary | ICD-10-CM

## 2016-02-04 DIAGNOSIS — O289 Unspecified abnormal findings on antenatal screening of mother: Secondary | ICD-10-CM

## 2016-02-04 DIAGNOSIS — Z3A20 20 weeks gestation of pregnancy: Secondary | ICD-10-CM | POA: Insufficient documentation

## 2016-02-04 DIAGNOSIS — Z362 Encounter for other antenatal screening follow-up: Secondary | ICD-10-CM | POA: Insufficient documentation

## 2016-02-04 DIAGNOSIS — Z369 Encounter for antenatal screening, unspecified: Secondary | ICD-10-CM

## 2016-03-27 ENCOUNTER — Other Ambulatory Visit: Payer: Self-pay | Admitting: *Deleted

## 2016-03-27 DIAGNOSIS — E669 Obesity, unspecified: Secondary | ICD-10-CM

## 2016-03-31 ENCOUNTER — Inpatient Hospital Stay: Admission: RE | Admit: 2016-03-31 | Payer: Medicaid Other | Source: Ambulatory Visit

## 2016-04-03 ENCOUNTER — Other Ambulatory Visit: Payer: Self-pay | Admitting: *Deleted

## 2016-04-03 DIAGNOSIS — E669 Obesity, unspecified: Secondary | ICD-10-CM

## 2016-04-03 LAB — HM HIV SCREENING LAB: HM HIV Screening: NEGATIVE

## 2016-04-07 ENCOUNTER — Ambulatory Visit
Admission: RE | Admit: 2016-04-07 | Discharge: 2016-04-07 | Disposition: A | Discharge status: Home / Self Care | Payer: Medicaid Other | Source: Ambulatory Visit | Attending: Obstetrics & Gynecology | Admitting: Obstetrics & Gynecology

## 2016-04-07 DIAGNOSIS — Z369 Encounter for antenatal screening, unspecified: Secondary | ICD-10-CM

## 2016-04-07 DIAGNOSIS — Z3A29 29 weeks gestation of pregnancy: Secondary | ICD-10-CM | POA: Insufficient documentation

## 2016-04-07 DIAGNOSIS — O289 Unspecified abnormal findings on antenatal screening of mother: Secondary | ICD-10-CM

## 2016-04-07 DIAGNOSIS — E669 Obesity, unspecified: Secondary | ICD-10-CM | POA: Insufficient documentation

## 2016-05-05 ENCOUNTER — Other Ambulatory Visit: Payer: Self-pay | Admitting: *Deleted

## 2016-05-05 DIAGNOSIS — O280 Abnormal hematological finding on antenatal screening of mother: Secondary | ICD-10-CM

## 2016-05-08 ENCOUNTER — Ambulatory Visit: Payer: Medicaid Other

## 2016-05-09 ENCOUNTER — Encounter: Payer: Medicaid Other | Attending: Family Medicine | Admitting: *Deleted

## 2016-05-09 ENCOUNTER — Encounter: Payer: Self-pay | Admitting: *Deleted

## 2016-05-09 VITALS — BP 124/80 | Ht 63.0 in | Wt 296.1 lb

## 2016-05-09 DIAGNOSIS — O24419 Gestational diabetes mellitus in pregnancy, unspecified control: Secondary | ICD-10-CM | POA: Insufficient documentation

## 2016-05-09 DIAGNOSIS — Z713 Dietary counseling and surveillance: Secondary | ICD-10-CM | POA: Insufficient documentation

## 2016-05-09 DIAGNOSIS — O24410 Gestational diabetes mellitus in pregnancy, diet controlled: Secondary | ICD-10-CM

## 2016-05-09 NOTE — Patient Instructions (Addendum)
Read booklet on Gestational Diabetes Follow Gestational Meal Planning Guidelines Avoid sugar sweetened beverages (juice, coffee) Avoid cold cereal and milk for breakfast Check blood sugars 2-4 x day - before breakfast and 2 hrs after every meal and record  If unable to afford Accu-Chek strips, purchase store brand meter for cheaper strips Bring blood sugar log to all appointments (MD office) Call MD for prescription for meter strips and lancets Strips  Accu-Chek Guide Lancets   Accu-Chek Fastclix Walk 20-30 minutes at least 5 x week if permitted by MD

## 2016-05-09 NOTE — Progress Notes (Signed)
Diabetes Self-Management Education  Visit Type: First/Initial  Appt. Start Time: 0845 Appt. End Time: 1025  05/09/2016  [name], identified by name and date of birth, is a 26 y.o. female with a diagnosis of Diabetes: Gestational Diabetes.   ASSESSMENT  Blood pressure 124/80, height 5\' 3"  (1.6 m), weight 296 lb 1.6 oz (134.3 kg), last menstrual period 09/13/2015. Body mass index is 52.45 kg/m.      Diabetes Self-Management Education - 05/09/16 1318      Visit Information   Visit Type First/Initial     Initial Visit   Diabetes Type Gestational Diabetes   Are you currently following a meal plan? No   Are you taking your medications as prescribed? Yes   Date Diagnosed 1 month ago     Health Coping   How would you rate your overall health? Excellent     Psychosocial Assessment   Patient Belief/Attitude about Diabetes Motivated to manage diabetes   Self-care barriers English as a second language   Self-management support Doctor's office   Other persons present Interpreter   Patient Concerns Glycemic Control   Special Needs Other (comment)  Materials in Spanish   Preferred Learning Style Auditory   Learning Readiness Ready   How often do you need to have someone help you when you read instructions, pamphlets, or other written materials from your doctor or pharmacy? 1 - Never  if written in Spanish   What is the last grade level you completed in school? 9th     Pre-Education Assessment   Patient understands the diabetes disease and treatment process. Needs Instruction   Patient understands incorporating nutritional management into lifestyle. Needs Instruction   Patient undertands incorporating physical activity into lifestyle. Needs Instruction   Patient understands using medications safely. Needs Instruction   Patient understands monitoring blood glucose, interpreting and using results Needs Instruction   Patient understands prevention, detection, and  treatment of acute complications. Needs Instruction   Patient understands prevention, detection, and treatment of chronic complications. Needs Instruction   Patient understands how to develop strategies to address psychosocial issues. Needs Instruction   Patient understands how to develop strategies to promote health/change behavior. Needs Instruction     Complications   How often do you check your blood sugar? 0 times/day (not testing)  Provided Accu-Chek Guide meter and instructed on use. BG upon return demonstration was 134 mg/dL at 09:8110:20 am - 2 1/2 hrs after drinking coffee with sugar   Have you had a dilated eye exam in the past 12 months? No   Have you had a dental exam in the past 12 months? No   Are you checking your feet? No     Dietary Intake   Breakfast cereal and milk; [place] sandwich; egg and cheese sandwich   Snack (morning) fruit   Lunch chicken soup with vegetables; tortillas; beans, egg, small amount of rice   Snack (afternoon) fruit   Dinner same was lunch; other vegetables include broccoli, lettuce, tomatoes, corn   Beverage(s) water, coffee with sugar, juuice     Exercise   Exercise Type Light (walking / raking leaves)   How many days per week to you exercise? 4   How many minutes per day do you exercise? 60   Total minutes per week of exercise 240     Patient Education   Previous Diabetes Education No   Disease state  Definition of diabetes, type 1 and 2, and the diagnosis of diabetes  Nutrition management  Role of diet in the treatment of diabetes and the relationship between the three main macronutrients and blood glucose level   Physical activity and exercise  Role of exercise on diabetes management, blood pressure control and cardiac health.   Monitoring Taught/evaluated SMBG meter.;Purpose and frequency of SMBG.;Taught/discussed recording of test results and interpretation of SMBG.;Ketone testing, when, how.   Chronic complications Relationship between  chronic complications and blood glucose control   Psychosocial adjustment Identified and addressed patients feelings and concerns about diabetes   Preconception care Pregnancy and GDM  Role of pre-pregnancy blood glucose control on the development of the fetus;Reviewed with patient blood glucose goals with pregnancy;Role of family planning for patients with diabetes     Individualized Goals (developed by patient)   Reducing Risk Improve blood sugars     Outcomes   Expected Outcomes Demonstrated interest in learning. Expect positive outcomes   Future DMSE Other (comment)  Pt doesn't have insurance and is not able to return for follow up appointment.    Program Status Not Completed      Individualized Plan for Diabetes Self-Management Training:   Learning Objective:  Patient will have a greater understanding of diabetes self-management. Patient education plan is to attend individual and/or group sessions per assessed needs and concerns.   Plan:   Patient Instructions  Read booklet on Gestational Diabetes Follow Gestational Meal Planning Guidelines Avoid sugar sweetened beverages (juice, coffee) Avoid cold cereal and milk for breakfast Check blood sugars 2-4 x day - before breakfast and 2 hrs after every meal and record  If unable to afford Accu-Chek strips, purchase store brand meter for cheaper strips Bring blood sugar log to all appointments (MD office) Call MD for prescription for meter strips and lancets Strips  Accu-Chek Guide Lancets   Accu-Chek Fastclix Walk 20-30 minutes at least 5 x week if permitted by MD  Expected Outcomes:  Demonstrated interest in learning. Expect positive outcomes  Education material provided:  Gestational Booklet (Spanish) Gestational Meal Planning Guidelines (Spanish) Viewed Gestational Diabetes Video (Spanish) Meter = Accu-Chek Guide and Accu-Chek savings card for test strips Goals for a Healthy Pregnancy  If problems or questions, patient to  contact team via:  Sharion Settler, RN, CCM, CDE [phone]  Future DSME appointment: Pt doesn't have insurance and is not able to return for follow up appointment due to cost.

## 2016-05-15 ENCOUNTER — Observation Stay
Admission: EM | Admit: 2016-05-15 | Discharge: 2016-05-15 | Disposition: A | Discharge status: Other Acute Inpatient Hospital | Payer: Self-pay | Attending: Obstetrics and Gynecology | Admitting: Obstetrics and Gynecology

## 2016-05-15 DIAGNOSIS — O289 Unspecified abnormal findings on antenatal screening of mother: Secondary | ICD-10-CM

## 2016-05-15 DIAGNOSIS — Z3A35 35 weeks gestation of pregnancy: Secondary | ICD-10-CM | POA: Insufficient documentation

## 2016-05-15 DIAGNOSIS — Z369 Encounter for antenatal screening, unspecified: Secondary | ICD-10-CM

## 2016-05-15 DIAGNOSIS — Z6841 Body Mass Index (BMI) 40.0 and over, adult: Secondary | ICD-10-CM | POA: Insufficient documentation

## 2016-05-15 DIAGNOSIS — O26893 Other specified pregnancy related conditions, third trimester: Secondary | ICD-10-CM | POA: Insufficient documentation

## 2016-05-15 DIAGNOSIS — O24410 Gestational diabetes mellitus in pregnancy, diet controlled: Secondary | ICD-10-CM | POA: Insufficient documentation

## 2016-05-15 DIAGNOSIS — N858 Other specified noninflammatory disorders of uterus: Principal | ICD-10-CM | POA: Insufficient documentation

## 2016-05-15 DIAGNOSIS — R030 Elevated blood-pressure reading, without diagnosis of hypertension: Secondary | ICD-10-CM | POA: Insufficient documentation

## 2016-05-15 DIAGNOSIS — O1493 Unspecified pre-eclampsia, third trimester: Secondary | ICD-10-CM | POA: Insufficient documentation

## 2016-05-15 DIAGNOSIS — O99213 Obesity complicating pregnancy, third trimester: Secondary | ICD-10-CM | POA: Insufficient documentation

## 2016-05-15 DIAGNOSIS — O479 False labor, unspecified: Secondary | ICD-10-CM | POA: Diagnosis present

## 2016-05-15 LAB — URINALYSIS, COMPLETE (UACMP) WITH MICROSCOPIC
Bilirubin Urine: NEGATIVE
Glucose, UA: NEGATIVE mg/dL
Hgb urine dipstick: NEGATIVE
KETONES UR: NEGATIVE mg/dL
Nitrite: NEGATIVE
Protein, ur: NEGATIVE mg/dL
Specific Gravity, Urine: 1.003 — ABNORMAL LOW (ref 1.005–1.030)
pH: 8 (ref 5.0–8.0)

## 2016-05-15 LAB — CBC
HEMATOCRIT: 38.3 % (ref 35.0–47.0)
HEMOGLOBIN: 13.2 g/dL (ref 12.0–16.0)
MCH: 27.5 pg (ref 26.0–34.0)
MCHC: 34.4 g/dL (ref 32.0–36.0)
MCV: 79.8 fL — ABNORMAL LOW (ref 80.0–100.0)
Platelets: 250 10*3/uL (ref 150–440)
RBC: 4.8 MIL/uL (ref 3.80–5.20)
RDW: 15.5 % — AB (ref 11.5–14.5)
WBC: 10 10*3/uL (ref 3.6–11.0)

## 2016-05-15 LAB — COMPREHENSIVE METABOLIC PANEL
ALT: 12 U/L — ABNORMAL LOW (ref 14–54)
ANION GAP: 6 (ref 5–15)
AST: 19 U/L (ref 15–41)
Albumin: 2.9 g/dL — ABNORMAL LOW (ref 3.5–5.0)
Alkaline Phosphatase: 167 U/L — ABNORMAL HIGH (ref 38–126)
BUN: 12 mg/dL (ref 6–20)
CALCIUM: 8.9 mg/dL (ref 8.9–10.3)
CO2: 20 mmol/L — ABNORMAL LOW (ref 22–32)
Chloride: 107 mmol/L (ref 101–111)
Creatinine, Ser: 0.62 mg/dL (ref 0.44–1.00)
GFR calc Af Amer: >60 mL/min (ref >60)
Glucose, Bld: 106 mg/dL — ABNORMAL HIGH (ref 65–99)
POTASSIUM: 4 mmol/L (ref 3.5–5.1)
Sodium: 133 mmol/L — ABNORMAL LOW (ref 135–145)
TOTAL PROTEIN: 6.8 g/dL (ref 6.5–8.1)
Total Bilirubin: 0.4 mg/dL (ref 0.3–1.2)

## 2016-05-15 LAB — GLUCOSE, CAPILLARY: GLUCOSE-CAPILLARY: 106 mg/dL — AB (ref 65–99)

## 2016-05-15 LAB — PROTEIN / CREATININE RATIO, URINE: CREATININE, URINE: 27 mg/dL

## 2016-05-15 LAB — TYPE AND SCREEN
ABO/RH(D): A POS
Antibody Screen: NEGATIVE

## 2016-05-15 MED ORDER — ACETAMINOPHEN 325 MG PO TABS: 650.0000 mg | ORAL_TABLET | ORAL | Status: DC | PRN

## 2016-05-15 MED ORDER — PRENATAL MULTIVITAMIN CH
1.0000 | ORAL_TABLET | Freq: Every day | ORAL | Status: DC
  Filled 2016-05-15: qty 1

## 2016-05-15 MED ORDER — MORPHINE SULFATE (PF) 10 MG/ML IV SOLN
10.0000 mg | Freq: Once | INTRAVENOUS | Status: AC
  Administered 2016-05-15: 10 mg via INTRAVENOUS
  Filled 2016-05-15: qty 1

## 2016-05-15 MED ORDER — LACTATED RINGERS IV SOLN
INTRAVENOUS | Status: DC
  Administered 2016-05-15: 16:00:00 via INTRAVENOUS

## 2016-05-15 MED ORDER — MAGNESIUM SULFATE BOLUS VIA INFUSION
4.0000 g | Freq: Once | INTRAVENOUS | Status: DC
  Filled 2016-05-15: qty 500

## 2016-05-15 MED ORDER — HYDRALAZINE HCL 20 MG/ML IJ SOLN
5.0000 mg | INTRAMUSCULAR | Status: DC | PRN
  Administered 2016-05-15: 10 mg via INTRAVENOUS
  Filled 2016-05-15: qty 1

## 2016-05-15 MED ORDER — MAGNESIUM SULFATE 50 % IJ SOLN
2.0000 g/h | INTRAVENOUS | Status: DC
  Administered 2016-05-15: 2 g/h via INTRAVENOUS
  Filled 2016-05-15: qty 80

## 2016-05-15 MED ORDER — HYDRALAZINE HCL 20 MG/ML IJ SOLN: 5.0000 mg | Freq: Once | INTRAMUSCULAR | Status: DC | PRN

## 2016-05-15 MED ORDER — DOCUSATE SODIUM 100 MG PO CAPS: 100.0000 mg | ORAL_CAPSULE | Freq: Every day | ORAL | Status: DC

## 2016-05-15 MED ORDER — LABETALOL HCL 5 MG/ML IV SOLN: 20.0000 mg | INTRAVENOUS | Status: DC | PRN

## 2016-05-15 MED ORDER — HYDRALAZINE HCL 20 MG/ML IJ SOLN
10.0000 mg | INTRAMUSCULAR | Status: DC | PRN
Start: 2016-05-15 — End: 2016-05-15

## 2016-05-15 MED ORDER — CALCIUM CARBONATE ANTACID 500 MG PO CHEW: 2.0000 | CHEWABLE_TABLET | ORAL | Status: DC | PRN

## 2016-05-15 MED ORDER — MAGNESIUM SULFATE 4 GM/100ML IV SOLN
INTRAVENOUS | Status: AC
  Administered 2016-05-15: 4 g
  Filled 2016-05-15: qty 100

## 2016-05-15 MED ORDER — SODIUM CHLORIDE FLUSH 0.9 % IV SOLN
INTRAVENOUS | Status: DC
Start: 2016-05-15 — End: 2016-05-15
  Filled 2016-05-15: qty 10

## 2016-05-15 NOTE — OB Triage Note (Signed)
Pt presents c/o back pain around 4:30 this morning, and regular contractions every 10 minutes apart and getting more intense pain. Denies any bleeding or LOF. Reports positive, though decreased fetal movement. Denies any NVD. Pt stated that she was told"her baby may be born prematurely because she lacks a chemical in her blood", However she doesn't remember what that chemical was called. She "was supposed to have an appointment the 8th or 9th that she missed and they were supposed to check to see if baby was still growing."

## 2016-05-15 NOTE — Discharge Summary (Signed)
Obstetric Discharge Summary Reason for Admission: Contractions Prenatal Procedures: NST. Preeclampsia workup  Hemoglobin  Date Value Ref Range Status  05/15/2016 13.2 12.0 - 16.0 g/dL Final   HGB  Date Value Ref Range Status  01/18/2013 10.4 (L) 12.0 - 16.0 g/dL Final   HCT  Date Value Ref Range Status  05/15/2016 38.3 35.0 - 47.0 % Final  01/16/2013 36.9 35.0 - 47.0 % Final    Physical Exam:  General: alert and no distress  Dilation: Closed Presentation: Vertex (per bedside U/S) Exam by:: Dr. Bonney AidStaebler  DVT Evaluation: No evidence of DVT seen on physical exam.  Discharge Diagnoses: Term Pregnancy-delivered  Discharge Information: Date: 05/15/2016 Activity: unrestricted Diet: routine Condition: stable Discharge to: home   Newborn Data: This patient has no babies on file. Home with mother.  Dayln Tugwell M 05/15/2016, 2:34 PM

## 2016-05-15 NOTE — Progress Notes (Signed)
Subjective:  Still feeling contractions, otherwise no new symptoms  Objective:   Vitals: Blood pressure (!) 155/94, pulse 88, temperature 97.8 F (36.6 C), temperature source Oral, height 5\' 2"  (1.575 m), weight 296 lb (134.3 kg), last menstrual period 09/13/2015. General: NAD Abdomen: gravid, non-tender Cervical Exam:  Dilation: Closed Presentation: Vertex (per bedside U/S) Exam by:: Dr. Bonney AidStaebler  FHT: 150, moderate, +accels, no decels Toco: q4-807min  Results for orders placed or performed during the hospital encounter of 05/15/16 (from the past 24 hour(s))  Urinalysis, Complete w Microscopic     Status: Abnormal   Collection Time: 05/15/16 12:16 PM  Result Value Ref Range   Color, Urine STRAW (A) YELLOW   APPearance CLEAR (A) CLEAR   Specific Gravity, Urine 1.003 (L) 1.005 - 1.030   pH 8.0 5.0 - 8.0   Glucose, UA NEGATIVE NEGATIVE mg/dL   Hgb urine dipstick NEGATIVE NEGATIVE   Bilirubin Urine NEGATIVE NEGATIVE   Ketones, ur NEGATIVE NEGATIVE mg/dL   Protein, ur NEGATIVE NEGATIVE mg/dL   Nitrite NEGATIVE NEGATIVE   Leukocytes, UA MODERATE (A) NEGATIVE   RBC / HPF 0-5 0 - 5 RBC/hpf   WBC, UA 6-30 0 - 5 WBC/hpf   Bacteria, UA RARE (A) NONE SEEN   Squamous Epithelial / LPF 6-30 (A) NONE SEEN   Mucous PRESENT    Hyaline Casts, UA PRESENT    Sperm, UA PRESENT   Protein / creatinine ratio, urine     Status: None   Collection Time: 05/15/16 12:16 PM  Result Value Ref Range   Creatinine, Urine 27 mg/dL   Total Protein, Urine <6 mg/dL   Protein Creatinine Ratio        0.00 - 0.15 mg/mg[Cre]  Type and screen [facility]     Status: None   Collection Time: 05/15/16 12:19 PM  Result Value Ref Range   ABO/RH(D) A POS    Antibody Screen NEG    Sample Expiration 05/18/2016   Comprehensive metabolic panel     Status: Abnormal   Collection Time: 05/15/16 12:19 PM  Result Value Ref Range   Sodium 133 (L) 135 - 145 mmol/L   Potassium 4.0 3.5 - 5.1 mmol/L   Chloride 107 101 - 111 mmol/L   CO2 20 (L) 22 - 32 mmol/L   Glucose, Bld 106 (H) 65 - 99 mg/dL   BUN 12 6 - 20 mg/dL   Creatinine, Ser 1.610.62 0.44 - 1.00 mg/dL   Calcium 8.9 8.9 - 09.610.3 mg/dL   Total Protein 6.8 6.5 - 8.1 g/dL   Albumin 2.9 (L) 3.5 - 5.0 g/dL   AST 19 15 - 41 U/L   ALT 12 (L) 14 - 54 U/L   Alkaline Phosphatase 167 (H) 38 - 126 U/L   Total Bilirubin 0.4 0.3 - 1.2 mg/dL   GFR calc non Af Amer >60 >60 mL/min   GFR calc Af Amer >60 >60 mL/min   Anion gap 6 5 - 15  CBC     Status: Abnormal   Collection Time: 05/15/16 12:19 PM  Result Value Ref Range   WBC 10.0 3.6 - 11.0 K/uL   RBC 4.80 3.80 - 5.20 MIL/uL   Hemoglobin 13.2 12.0 - 16.0 g/dL   HCT 04.538.3 40.935.0 - 81.147.0 %   MCV 79.8 (L) 80.0 - 100.0 fL   MCH 27.5 26.0 - 34.0 pg   MCHC 34.4 32.0 - 36.0 g/dL   RDW 91.415.5 (H) 78.211.5 - 95.614.5 %   Platelets  250 150 - 440 K/uL  Glucose, capillary     Status: Abnormal   Collection Time: 05/15/16 12:19 PM  Result Value Ref Range   Glucose-Capillary 106 (H) 65 - 99 mg/dL   Comment 1 Notify RN     Assessment:   26 y.o. G3P2 [redacted]w[redacted]d with GHTN vs preeclampsia  Plan:   1) GHTN/Pre-eclampsia - will transfer to [facility] for further evaluation.  Cervix remains unchanged.  Given concern of accuracy of BP reading will start magnesium sulfate for transport.  2) Fetus - cat I tracing

## 2016-05-15 NOTE — Progress Notes (Addendum)
[facility] life flight here to pick up patient for transport. Report given, [facility]. [facility] and [facility]. Pt signed consent and agrees to transport, all Prenatal records sent with [facility] transport team.   [facility]

## 2016-05-15 NOTE — H&P (Signed)
Obstetric H&P   Chief Complaint: Contractions  Prenatal Care Provider: [facility]  History of Present Illness: 26 y.o. G3P2002 [id number] by 10 week US derived EDD of  06/19/2016,  presenting to L&D with contractions which started this morning approximately every 10min.  No LOF, no VB, no ctx.  Her prior pregnancies were term vaginal births.  On presentation she was noted to have elevated blood pressure.  On presentation two severe range BP 170/106 and 177/85 but issues with cuff staying in place.  Location of cuff had to be changed to forearm.  BP's at this site remain mild range.  The patient denies prior history of blood pressure issues preceding this pregnancy or during this pregnancy.  Denies HA, does report occasionally blurry vision, increased edema in the past week, no RUQ or epigastricp ain.  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  Interestingly based on reviews of records from prior pregnancy in 2014 she also had an elevated DSR in that pregnancy.  She missed her follow up 33 week growth scan on 05/05/16.  Growth US 04/07/2016 at 594w4d 1516g or 3lbs 5oz, c/w 55%ile  PNL: A pos / ABSC neg / RI / VZI / RPR NR / HIV neg / RPR NR / HBsAg neg / 1-hr of 191 (3-hr 107/214/237/177) / GBS unknown  Review of Systems: 10 point review of systems negative unless otherwise noted in HPI  Past Medical History: History reviewed. No pertinent past medical history.  Past Surgical History: Past Surgical History:  Procedure Laterality Date  . gallstone    Cholecystectomy 2014  Family History: History reviewed. No pertinent family history.  Social History: Social History   Social History  . Marital status: Married    Spouse name:  N/A  . Number of children: N/A  . Years of education: N/A   Occupational History  . Not on file.   Social History Main Topics  . Smoking status: Never Smoker  . Smokeless tobacco: Never Used  . Alcohol use No  . Drug use: No  . Sexual activity: Yes   Other Topics Concern  . Not on file   Social History Narrative  . No narrative on file    Medications: Prior to Admission medications   Medication Sig Start Date End Date Taking? Authorizing Provider  Prenatal Vit-Fe Fumarate-FA (PRENATAL MULTIVITAMIN) TABS tablet Take 1 tablet by mouth daily at 12 noon.    Historical Provider, MD    Allergies: No Known Allergies  Physical Exam: Vitals:   05/15/16 1213 05/15/16 1228 05/15/16 1243 05/15/16 1312  BP: (!) 152/93 (!) 156/92 (!) 143/94 (!) 149/88  Pulse: 78 78 82 81  Temp:      TempSrc:      Weight:      Height:        Urine Dip Protein: Negative, also negative for glucose and ketones  BG 109 on fingerstick  FHT: 140, moderate, +accels, no decels Toco: q308min  General: NAD HEENT: normocephalic, anicteric Pulmonary: no increased work of breathing Cardiovascular: RRR Abdomen: Gravid, non-tender Leopolds: limited by habitus, vtx on bedside US Genitourinary: Dilation: Closed Presentation: Vertex (per bedside U/S) Exam by:: Zerita BoersKelly Yates RN  Extremities: no edema  Labs: Results for orders placed or  performed during the hospital encounter of 05/15/16 (from the past 24 hour(s))  Urinalysis, Complete w Microscopic     Status: Abnormal   Collection Time: 05/15/16 12:16 PM  Result Value Ref Range   Color, Urine STRAW (A) YELLOW   APPearance CLEAR (A) CLEAR   Specific Gravity, Urine 1.003 (L) 1.005 - 1.030   pH 8.0 5.0 - 8.0   Glucose, UA NEGATIVE NEGATIVE mg/dL   Hgb urine dipstick NEGATIVE NEGATIVE   Bilirubin Urine NEGATIVE NEGATIVE   Ketones, ur NEGATIVE NEGATIVE mg/dL   Protein, ur NEGATIVE NEGATIVE mg/dL   Nitrite NEGATIVE NEGATIVE   Leukocytes, UA MODERATE  (A) NEGATIVE   RBC / HPF 0-5 0 - 5 RBC/hpf   WBC, UA 6-30 0 - 5 WBC/hpf   Bacteria, UA RARE (A) NONE SEEN   Squamous Epithelial / LPF 6-30 (A) NONE SEEN   Mucous PRESENT    Hyaline Casts, UA PRESENT    Sperm, UA PRESENT   Comprehensive metabolic panel     Status: Abnormal   Collection Time: 05/15/16 12:19 PM  Result Value Ref Range   Sodium 133 (L) 135 - 145 mmol/L   Potassium 4.0 3.5 - 5.1 mmol/L   Chloride 107 101 - 111 mmol/L   CO2 20 (L) 22 - 32 mmol/L   Glucose, Bld 106 (H) 65 - 99 mg/dL   BUN 12 6 - 20 mg/dL   Creatinine, Ser 1.61 0.44 - 1.00 mg/dL   Calcium 8.9 8.9 - 09.6 mg/dL   Total Protein 6.8 6.5 - 8.1 g/dL   Albumin 2.9 (L) 3.5 - 5.0 g/dL   AST 19 15 - 41 U/L   ALT 12 (L) 14 - 54 U/L   Alkaline Phosphatase 167 (H) 38 - 126 U/L   Total Bilirubin 0.4 0.3 - 1.2 mg/dL   GFR calc non Af Amer >60 >60 mL/min   GFR calc Af Amer >60 >60 mL/min   Anion gap 6 5 - 15  CBC     Status: Abnormal   Collection Time: 05/15/16 12:19 PM  Result Value Ref Range   WBC 10.0 3.6 - 11.0 K/uL   RBC 4.80 3.80 - 5.20 MIL/uL   Hemoglobin 13.2 12.0 - 16.0 g/dL   HCT 04.5 40.9 - 81.1 %   MCV 79.8 (L) 80.0 - 100.0 fL   MCH 27.5 26.0 - 34.0 pg   MCHC 34.4 32.0 - 36.0 g/dL   RDW 91.4 (H) 78.2 - 95.6 %   Platelets 250 150 - 440 K/uL  Glucose, capillary     Status: Abnormal   Collection Time: 05/15/16 12:19 PM  Result Value Ref Range   Glucose-Capillary 106 (H) 65 - 99 mg/dL   Comment 1 Notify RN     Assessment: 26 y.o. G3P2 [redacted]w[redacted]d by 06/19/2016, presenting for r/o labor but noted to have elevated BP on presentation  Plan: 1) GHTN - at the very least the patient has GHTN.  She did have 2 severe range BP's on initial presentation with remainder mild range.  However, given co-morbidities including morbid obesity, low PAPP-A, and GDMA1 taking into concert she probably requires closer observation.  Given her BMI she is not a candidate for delivery at [facility] do to anesthesia cut off of BMI for  obstetric patient's being 50.  Will contact [facility] to discuss case and management.  May be candidate for outpatient follow up if labs normal and BP's remain mild range unless [facility] feels strongly about proceeding with delivery in  which case we would initiate transfer  2) Fetus - cat I tracing  3) PNL -   A pos / ABSC neg / RI / VZI / RPR NR / HIV neg / RPR NR / HBsAg neg / 1-hr of 191 (3-hr 107/214/237/177) / GBS unknown  4) TDAP - UTD received 04/03/16, received influenza vaccination 03/14/16  5) Disposition - pending follow up labs and discussion with DP

## 2016-05-15 NOTE — Progress Notes (Signed)
MD at bedside to discuss elevated BP with patient and FOB, interpreter present. Discussed labs which have been resulted, informed pt that not all labs back yet. [facility] if continued issues with labs, BP.

## 2016-06-18 DIAGNOSIS — Z8632 Personal history of gestational diabetes: Secondary | ICD-10-CM | POA: Insufficient documentation

## 2016-10-14 ENCOUNTER — Encounter (HOSPITAL_COMMUNITY): Payer: Self-pay

## 2018-10-13 ENCOUNTER — Telehealth: Payer: Self-pay

## 2018-10-13 DIAGNOSIS — Z20822 Contact with and (suspected) exposure to covid-19: Secondary | ICD-10-CM

## 2018-10-13 NOTE — Telephone Encounter (Signed)
Received call to schedule patient for COVID-19 testing.  [facility] Department Office [phone]  Call placed to patient Appointment scheduled and order placed.

## 2018-10-14 ENCOUNTER — Other Ambulatory Visit: Payer: Self-pay

## 2018-10-14 DIAGNOSIS — Z20822 Contact with and (suspected) exposure to covid-19: Secondary | ICD-10-CM

## 2018-10-16 LAB — NOVEL CORONAVIRUS, NAA: SARS-CoV-2, NAA: NOT DETECTED

## 2019-05-05 DIAGNOSIS — Z6841 Body Mass Index (BMI) 40.0 and over, adult: Secondary | ICD-10-CM

## 2019-05-06 ENCOUNTER — Encounter: Payer: Self-pay | Admitting: Physician Assistant

## 2019-05-06 ENCOUNTER — Ambulatory Visit (LOCAL_COMMUNITY_HEALTH_CENTER): Payer: Self-pay | Admitting: Physician Assistant

## 2019-05-06 ENCOUNTER — Other Ambulatory Visit: Payer: Self-pay

## 2019-05-06 VITALS — BP 139/92 | Ht 64.0 in | Wt 309.0 lb

## 2019-05-06 DIAGNOSIS — Z3009 Encounter for other general counseling and advice on contraception: Secondary | ICD-10-CM

## 2019-05-06 DIAGNOSIS — Z30013 Encounter for initial prescription of injectable contraceptive: Secondary | ICD-10-CM

## 2019-05-06 DIAGNOSIS — Z3046 Encounter for surveillance of implantable subdermal contraceptive: Secondary | ICD-10-CM

## 2019-05-06 MED ORDER — MEDROXYPROGESTERONE ACETATE 150 MG/ML IM SUSP
150.0000 mg | Freq: Once | INTRAMUSCULAR | Status: AC
  Administered 2019-05-06: 17:00:00 150 mg via INTRAMUSCULAR

## 2019-05-06 NOTE — Progress Notes (Signed)
Depo given and tolerated well. Explained to patient to scheduled next Depo appt for Physical also. Tawny Hopping, RN

## 2019-05-06 NOTE — Progress Notes (Signed)
Here today for Nexplanon removal. Wants to start Depo. Last PE with Nexplanon insertion was 06/24/2016. Last Pap Smear was 07/10/2015 (NIL results.) Tawny Hopping, RN

## 2019-05-09 NOTE — Progress Notes (Signed)
Family Planning Visit-   Subjective:  Erika Maxwell is a 29 y.o. being seen today for a Nexplanon removal and wants to start Depo.    She is currently using Nexplanon for pregnancy prevention. Patient reports she does not want a pregnancy in the next year. Patient  has First trimester screening; Abnormal first trimester screen; Irregular contractions; History of diet controlled gestational diabetes mellitus (GDM); and Obesity, unspecified on their problem list.  Chief Complaint  Patient presents with  . Contraception  . Procedure    Patient reports that she wants her Nexplanon removed and to start Depo today.  Per chart, last RP 06/24/2016 and last pap 2017.  Denies any concerns or symptoms.  Patient denies any changes to her personal and family history since her last visit.     Does the patient desire a pregnancy in the next year? (OKQ flowsheet)  See flowsheet for other program required questions.   Body mass index is 53.04 kg/m. - Patient is eligible for diabetes screening based on BMI and age >66?  not applicable YQ0H ordered? not applicable  Patient reports 1 of partners in last year. Desires STI screening?  No - .  Does the patient have a current or past history of drug use? No   No components found for: HCV]   Health Maintenance Due  Topic Date Due  . PAP SMEAR-Modifier  07/09/2018  . INFLUENZA VACCINE  11/27/2018    Review of Systems  All other systems reviewed and are negative.   The following portions of the patient's history were reviewed and updated as appropriate: allergies, current medications, past family history, past medical history, past social history, past surgical history and problem list. Problem list updated.  Objective:   Vitals:   05/06/19 1525  BP: (!) 139/92  Weight: (!) 309 lb (140.2 kg)  Height: 5\' 4"  (1.626 m)    Physical Exam Vitals and nursing note reviewed.  Constitutional:      General: She is not in acute  distress.    Appearance: Normal appearance. She is obese.  HENT:     Head: Normocephalic and atraumatic.  Pulmonary:     Effort: Pulmonary effort is normal.  Neurological:     Mental Status: She is alert and oriented to person, place, and time.  Psychiatric:        Mood and Affect: Mood normal.        Behavior: Behavior normal.        Thought Content: Thought content normal.        Judgment: Judgment normal.       Assessment and Plan:  Erika Maxwell is a 29 y.o. female presenting to the [place] for a Nexplanon removal and Depo start visit.  Contraception counseling: Reviewed all forms of birth control options in the tiered based approach. available including abstinence; over the counter/barrier methods; hormonal contraceptive medication including pill, patch, ring, injection,contraceptive implant; hormonal and nonhormonal IUDs; permanent sterilization options including vasectomy and the various tubal sterilization modalities. Risks, benefits, and typical effectiveness rates were reviewed.  Questions were answered.  Written information was also given to the patient to review.  Patient desires Depo, this was prescribed for patient. She will follow up in  3 months for RP and prn for surveillance.  She was told to call with any further questions, or with any concerns about this method of contraception.  Emphasized use of condoms 100% of the time for STI prevention.  Patient was not a candidate for ECP today.   1. Encounter for counseling regarding contraception Counseled patient re: differences between Depo and Nexplanon as well as similarities. Rec using condoms with all sex for 2 weeks after shot today. Reviewed with patient when to call clinic for irregular bleeding. - medroxyPROGESTERone (DEPO-PROVERA) injection 150 mg  2. Nexplanon removal Nexplanon Removal Patient identified, informed consent performed, consent signed.   Appropriate time out  taken. Nexplanon site identified.  Area prepped in usual sterile fashon. 3 ml of 1% lidocaine with Epinephrine was used to anesthetize the area at the distal end of the implant and along implant site. A small stab incision was made right beside the implant on the distal portion.  The Nexplanon rod was grasped manually and removed without difficulty.  There was minimal blood loss. There were no complications.  Steri-strips were applied over the small incision.  A pressure bandage was applied to reduce any bruising.  The patient tolerated the procedure well and was given post procedure instructions.    Nexplanon:   Counseled patient to take OTC analgesic starting as soon as lidocaine starts to wear off and take regularly for at least 48 hr to decrease discomfort.  Specifically to take with food or milk to decrease stomach upset and for IB 600 mg (3 tablets) every 6 hrs; IB 800 mg (4 tablets) every 8 hrs; or Aleve 2 tablets every 12 hrs.    3. Initiation of Depo Provera OK for Depo 150mg  IM today. RTC for RP with pap and next Depo in ~12 weeks. - medroxyPROGESTERone (DEPO-PROVERA) injection 150 mg     No follow-ups on file.  No future appointments.  , PA

## 2019-07-22 ENCOUNTER — Encounter: Payer: Self-pay | Admitting: Physician Assistant

## 2019-07-22 ENCOUNTER — Ambulatory Visit: Payer: Self-pay

## 2019-07-22 ENCOUNTER — Ambulatory Visit (LOCAL_COMMUNITY_HEALTH_CENTER): Payer: Self-pay | Admitting: Physician Assistant

## 2019-07-22 VITALS — BP 122/64 | Ht 64.0 in | Wt 309.8 lb

## 2019-07-22 DIAGNOSIS — Z30013 Encounter for initial prescription of injectable contraceptive: Secondary | ICD-10-CM

## 2019-07-22 DIAGNOSIS — Z3009 Encounter for other general counseling and advice on contraception: Secondary | ICD-10-CM

## 2019-07-22 DIAGNOSIS — Z3042 Encounter for surveillance of injectable contraceptive: Secondary | ICD-10-CM

## 2019-07-22 MED ORDER — MEDROXYPROGESTERONE ACETATE 150 MG/ML IM SUSP
150.0000 mg | Freq: Once | INTRAMUSCULAR | Status: AC
  Administered 2019-07-22: 15:00:00 150 mg via INTRAMUSCULAR

## 2019-07-22 NOTE — Progress Notes (Signed)
In for Depo; declines pap due to menses Sharlette Dense, RN

## 2019-07-22 NOTE — Progress Notes (Signed)
Patient into clinic for Depo today.  Per note from last visit, patient was to get pap today as well but she declines since she is having heavy bleeding today.  Discussed with patient importance of regular pap monitoring and patient states that she will get her pap at her next visit.  OK for patient to receive Depo 150mg  IM today.

## 2019-09-28 ENCOUNTER — Telehealth: Payer: Self-pay | Admitting: Family Medicine

## 2019-09-28 NOTE — Telephone Encounter (Signed)
wants to come in for an IUD insertion.

## 2019-09-29 NOTE — Telephone Encounter (Signed)
This RN returned pt's call at phone # provided with Danela (ID [phone]) from [facility] to interpret. Pt reports that she is currently on Depo as her BCM but wants to switch to the Mirena IUD. Pt reports she believes she has had the "5 year IUD" in the past without issues. Pt's last Depo was 07/22/2019, so pt is 9 weeks and 6 days post her last Depo. Pt also needs a pap and physical as well, so pt scheduled with PE, pap and Mirena insertion for 10/04/2019 at 2:00pm per pt request. Pt aware of appt date and time and to arrive early for check-in. This RN attempted to go over IUD consult with pt, but the connection was going in and out so unable to complete consult completely. Pt with no other questions and concerns at this time.Lyman Speller, RN

## 2019-09-29 NOTE — Telephone Encounter (Signed)
Phone call to pt with [employer] interpreter. Left message on voicemail that RN with [facility] is returning phone call.   (See chart review notes: Due for pap also. No full physical in Epic)

## 2019-10-04 ENCOUNTER — Ambulatory Visit (LOCAL_COMMUNITY_HEALTH_CENTER): Payer: Self-pay | Admitting: Family Medicine

## 2019-10-04 ENCOUNTER — Encounter: Payer: Self-pay | Admitting: Family Medicine

## 2019-10-04 ENCOUNTER — Other Ambulatory Visit: Payer: Self-pay

## 2019-10-04 VITALS — BP 116/79 | Ht 64.0 in | Wt 312.8 lb

## 2019-10-04 DIAGNOSIS — Z01419 Encounter for gynecological examination (general) (routine) without abnormal findings: Secondary | ICD-10-CM

## 2019-10-04 DIAGNOSIS — Z30014 Encounter for initial prescription of intrauterine contraceptive device: Secondary | ICD-10-CM

## 2019-10-04 DIAGNOSIS — Z30430 Encounter for insertion of intrauterine contraceptive device: Secondary | ICD-10-CM

## 2019-10-04 MED ORDER — LEVONORGESTREL 20 MCG/24HR IU IUD
1.0000 | INTRAUTERINE_SYSTEM | Freq: Once | INTRAUTERINE | Status: AC
  Administered 2019-10-04: 15:00:00 1 via INTRAUTERINE

## 2019-10-04 MED ORDER — MULTIVITAMINS PO CAPS: 1.0000 | ORAL_CAPSULE | Freq: Every day | ORAL | 0 refills | Status: AC

## 2019-10-04 NOTE — Progress Notes (Signed)
Here today for PE, Pap Smear and IUD  Insertion. Last PE was 06/24/2016, last Pap Smear was 07/10/2015 and last Depo was 07/22/19 (10.4 weeks.) Tawny Hopping, RN

## 2019-10-04 NOTE — Addendum Note (Signed)
Addended by: Tawny Hopping A on: 10/04/2019 04:50 PM   Modules accepted: Orders

## 2019-10-04 NOTE — Progress Notes (Signed)
Family Planning Visit- Initial Visit  Subjective:  Erika Maxwell is a 29 y.o.  [id number]   being seen today for an initial well woman visit and to discuss family planning options.  She is currently using Depo-Provera injections for pregnancy prevention. Patient reports she does not want a pregnancy in the next year.  Patient has the following medical conditions has Irregular contractions; History of diet controlled gestational diabetes mellitus (GDM); and Morbid obesity (HCC) on their problem list.  Chief Complaint  Patient presents with  . Contraception  . Procedure    IUD Insertion   Patient reports no concerns. Reports mild HA in the past 2 wks. Last pap in 2017. Declines STI screening today.   Body mass index is 53.69 kg/m. - Patient is eligible for diabetes screening based on BMI and age >32?  no HA1C ordered? no  Patient reports 1 of partners in last year. Desires STI screening?  No - declines  Has patient been screened once for HCV in the past?  No  No results found for: HCVAB  Does the patient have current of drug use, have a partner with drug use, and/or has been incarcerated since last result? No  If yes-- Screen for HCV through [facility]   Does the patient meet criteria for HBV testing? No  Criteria:  -Household, sexual or needle sharing contact with HBV -History of drug use -HIV positive -Those with known Hep C   Health Maintenance Due  Topic Date Due  . Hepatitis C Screening  Never done  . COVID-19 Vaccine (1) Never done  . PAP SMEAR-Modifier  07/09/2018    Review of Systems  Constitutional: Negative for chills and fever.  Eyes: Negative for blurred vision and double vision.  Respiratory: Negative for cough and shortness of breath.   Cardiovascular: Negative for chest pain and orthopnea.  Gastrointestinal: Negative for nausea and vomiting.  Genitourinary: Negative for dysuria, flank pain and frequency.  Musculoskeletal: Negative for myalgias.   Skin: Negative for rash.  Neurological: Negative for dizziness, tingling, weakness and headaches.  Endo/Heme/Allergies: Does not bruise/bleed easily.  Psychiatric/Behavioral: Negative for depression and suicidal ideas. The patient is not nervous/anxious.     The following portions of the patient's history were reviewed and updated as appropriate: allergies, current medications, past family history, past medical history, past social history, past surgical history and problem list. Problem list updated.   See flowsheet for other program required questions.  Objective:   Vitals:   10/04/19 1340  BP: 116/79  Weight: (!) 312 lb 12.8 oz (141.9 kg)  Height: 5\' 4"  (1.626 m)    Physical Exam Exam conducted with a chaperone present.  Constitutional:      Appearance: Normal appearance.  HENT:     Head: Normocephalic and atraumatic.  Pulmonary:     Effort: Pulmonary effort is normal.  Abdominal:     Palpations: Abdomen is soft.  Genitourinary:    Vagina: Normal.     Cervix: Normal.     Uterus: Normal.   Musculoskeletal:        General: Normal range of motion.  Skin:    General: Skin is warm and dry.  Neurological:     General: No focal deficit present.     Mental Status: She is alert.  Psychiatric:        Mood and Affect: Mood normal.        Behavior: Behavior normal.    Patient presented to [facility] for IUD insertion.  Her GC/CT screening was found to be up to date- only 1 partner and does not desire today. Using WHO criteria we can be reasonably certain she is not pregnant or a pregnancy test was obtained which was Urine pregnancy test today was N/A- up to date with Depo injections.  See Flowsheet for IUD check list  IUD Insertion Procedure Note Patient identified, informed consent performed, consent signed.   Discussed risks of irregular bleeding, cramping, infection, malpositioning or misplacement of the IUD outside the uterus which may require further procedure such as  laparoscopy. Time out was performed.    Speculum placed in the vagina.  Cervix visualized.  Cleaned with Betadine x 2.  Grasped anteriorly with a single tooth tenaculum.  Uterus sounded to 9 cm.  IUD placed per manufacturer's recommendations.  Strings trimmed to 3 cm. Tenaculum was removed, good hemostasis noted.  Patient tolerated procedure well.    Assessment and Plan:  Erika Maxwell is a 29 y.o. female presenting to the [facility] Department for an initial well woman exam/family planning visit  Contraception counseling: Reviewed all forms of birth control options in the tiered based approach. available including abstinence; over the counter/barrier methods; hormonal contraceptive medication including pill, patch, ring, injection,contraceptive implant, ECP; hormonal and nonhormonal IUDs; permanent sterilization options including vasectomy and the various tubal sterilization modalities. Risks, benefits, and typical effectiveness rates were reviewed.  Questions were answered.  Written information was also given to the patient to review.  Patient desires IUD- placed today, this was prescribed for patient. She will follow up in  72yr for surveillance.  She was told to call with any further questions, or with any concerns about this method of contraception.  Emphasized use of condoms 100% of the time for STI prevention.  1. Well woman exam with routine gynecological exam - RN to give MVI - Counseled on diet and exercise - Declines STI screening today - Self history form reviewed - IGP, Aptima HPV - levonorgestrel (MIRENA) 20 MCG/24HR IUD 1 each  2. Encounter for IUD insertion - levonorgestrel (MIRENA) 20 MCG/24HR IUD 1 each -Patient was given post-procedure instructions- both agency handout and verbally by provider.  She was advised to have backup contraception for one week.  Patient was also asked to check IUD strings periodically or follow up in 4 weeks for IUD check.  3.  Encounter for initial prescription of intrauterine contraceptive device (IUD)  4. Morbid Obesity+history of GDM - Needs HA1C, does not meet state criteria for screening through [facility] program. Recommended PCP care. Given list of PCP offices   Return in about 4 weeks (around 11/01/2019) for IUD string check.  No future appointments.  Caren Macadam, MD

## 2019-10-07 LAB — IGP, APTIMA HPV
HPV Aptima: NEGATIVE
PAP Smear Comment: 0

## 2022-05-20 ENCOUNTER — Emergency Department: Payer: Medicaid Other

## 2022-05-20 ENCOUNTER — Encounter: Payer: Self-pay | Admitting: *Deleted

## 2022-05-20 ENCOUNTER — Inpatient Hospital Stay
Admission: EM | Admit: 2022-05-20 | Discharge: 2022-05-25 | DRG: 872 | Disposition: A | Discharge status: Home / Self Care | Payer: Medicaid Other | Attending: Internal Medicine | Admitting: Internal Medicine

## 2022-05-20 ENCOUNTER — Other Ambulatory Visit: Payer: Self-pay

## 2022-05-20 DIAGNOSIS — E871 Hypo-osmolality and hyponatremia: Secondary | ICD-10-CM | POA: Diagnosis not present

## 2022-05-20 DIAGNOSIS — L03319 Cellulitis of trunk, unspecified: Secondary | ICD-10-CM | POA: Diagnosis not present

## 2022-05-20 DIAGNOSIS — L02219 Cutaneous abscess of trunk, unspecified: Secondary | ICD-10-CM | POA: Diagnosis not present

## 2022-05-20 DIAGNOSIS — E1165 Type 2 diabetes mellitus with hyperglycemia: Secondary | ICD-10-CM | POA: Diagnosis present

## 2022-05-20 DIAGNOSIS — E559 Vitamin D deficiency, unspecified: Secondary | ICD-10-CM | POA: Diagnosis present

## 2022-05-20 DIAGNOSIS — Z6841 Body Mass Index (BMI) 40.0 and over, adult: Secondary | ICD-10-CM | POA: Diagnosis not present

## 2022-05-20 DIAGNOSIS — A419 Sepsis, unspecified organism: Principal | ICD-10-CM | POA: Diagnosis present

## 2022-05-20 DIAGNOSIS — Z8632 Personal history of gestational diabetes: Secondary | ICD-10-CM | POA: Diagnosis present

## 2022-05-20 DIAGNOSIS — Z8719 Personal history of other diseases of the digestive system: Secondary | ICD-10-CM | POA: Diagnosis not present

## 2022-05-20 DIAGNOSIS — E876 Hypokalemia: Secondary | ICD-10-CM | POA: Diagnosis present

## 2022-05-20 DIAGNOSIS — M793 Panniculitis, unspecified: Secondary | ICD-10-CM | POA: Diagnosis present

## 2022-05-20 LAB — BASIC METABOLIC PANEL
Anion gap: 12 (ref 5–15)
BUN: 12 mg/dL (ref 6–20)
CO2: 23 mmol/L (ref 22–32)
Calcium: 8.5 mg/dL — ABNORMAL LOW (ref 8.9–10.3)
Chloride: 93 mmol/L — ABNORMAL LOW (ref 98–111)
Creatinine, Ser: 0.74 mg/dL (ref 0.44–1.00)
GFR, Estimated: >60 mL/min (ref >60)
Glucose, Bld: 385 mg/dL — ABNORMAL HIGH (ref 70–99)
Potassium: 3.7 mmol/L (ref 3.5–5.1)
Sodium: 128 mmol/L — ABNORMAL LOW (ref 135–145)

## 2022-05-20 LAB — CBC
HCT: 41.7 % (ref 36.0–46.0)
Hemoglobin: 14 g/dL (ref 12.0–15.0)
MCH: 28.3 pg (ref 26.0–34.0)
MCHC: 33.6 g/dL (ref 30.0–36.0)
MCV: 84.2 fL (ref 80.0–100.0)
Platelets: 227 10*3/uL (ref 150–400)
RBC: 4.95 MIL/uL (ref 3.87–5.11)
RDW: 12.4 % (ref 11.5–15.5)
WBC: 17.8 10*3/uL — ABNORMAL HIGH (ref 4.0–10.5)
nRBC: 0 % (ref 0.0–0.2)

## 2022-05-20 LAB — HCG, QUANTITATIVE, PREGNANCY: hCG, Beta Chain, Quant, S: 1 m[IU]/mL (ref <5)

## 2022-05-20 LAB — LACTIC ACID, PLASMA
Lactic Acid, Venous: 2.2 mmol/L (ref 0.5–1.9)
Lactic Acid, Venous: 1.6 mmol/L (ref 0.5–1.9)

## 2022-05-20 LAB — POC URINE PREG, ED: Preg Test, Ur: NEGATIVE

## 2022-05-20 MED ORDER — SODIUM CHLORIDE 0.9 % IV SOLN
2.0000 g | Freq: Three times a day (TID) | INTRAVENOUS | Status: DC
  Administered 2022-05-21 – 2022-05-25 (×13): 2 g via INTRAVENOUS
  Filled 2022-05-20 (×2): qty 12.5
  Filled 2022-05-20 (×3): qty 2
  Filled 2022-05-20: qty 12.5
  Filled 2022-05-20 (×4): qty 2
  Filled 2022-05-20: qty 12.5
  Filled 2022-05-20 (×2): qty 2
  Filled 2022-05-20: qty 12.5
  Filled 2022-05-20: qty 2

## 2022-05-20 MED ORDER — VANCOMYCIN HCL IN DEXTROSE 1-5 GM/200ML-% IV SOLN
1000.0000 mg | Freq: Once | INTRAVENOUS | Status: AC
  Administered 2022-05-20: 1000 mg via INTRAVENOUS

## 2022-05-20 MED ORDER — ACETAMINOPHEN 325 MG PO TABS
650.0000 mg | ORAL_TABLET | Freq: Four times a day (QID) | ORAL | Status: DC | PRN
  Administered 2022-05-21 – 2022-05-23 (×6): 650 mg via ORAL
  Filled 2022-05-20 (×6): qty 2

## 2022-05-20 MED ORDER — SODIUM CHLORIDE 0.9 % IV SOLN: 2.0000 g | Freq: Once | INTRAVENOUS | Status: DC

## 2022-05-20 MED ORDER — VANCOMYCIN HCL IN DEXTROSE 1-5 GM/200ML-% IV SOLN: 1000.0000 mg | Freq: Once | INTRAVENOUS | Status: DC

## 2022-05-20 MED ORDER — ACETAMINOPHEN 650 MG RE SUPP: 650.0000 mg | Freq: Four times a day (QID) | RECTAL | Status: DC | PRN

## 2022-05-20 MED ORDER — LACTATED RINGERS IV SOLN: INTRAVENOUS | Status: DC

## 2022-05-20 MED ORDER — ONDANSETRON HCL 4 MG PO TABS: 4.0000 mg | ORAL_TABLET | Freq: Four times a day (QID) | ORAL | Status: DC | PRN

## 2022-05-20 MED ORDER — METRONIDAZOLE 500 MG/100ML IV SOLN
500.0000 mg | Freq: Once | INTRAVENOUS | Status: AC
  Administered 2022-05-20: 500 mg via INTRAVENOUS
  Filled 2022-05-20: qty 100

## 2022-05-20 MED ORDER — LACTATED RINGERS IV BOLUS (SEPSIS)
500.0000 mL | Freq: Once | INTRAVENOUS | Status: AC
  Administered 2022-05-20: 500 mL via INTRAVENOUS

## 2022-05-20 MED ORDER — INSULIN ASPART 100 UNIT/ML IJ SOLN
0.0000 [IU] | Freq: Three times a day (TID) | INTRAMUSCULAR | Status: DC
  Administered 2022-05-21: 7 [IU] via SUBCUTANEOUS
  Administered 2022-05-21: 11 [IU] via SUBCUTANEOUS
  Filled 2022-05-20 (×2): qty 1

## 2022-05-20 MED ORDER — ENOXAPARIN SODIUM 80 MG/0.8ML IJ SOSY
0.5000 mg/kg | PREFILLED_SYRINGE | INTRAMUSCULAR | Status: DC
  Administered 2022-05-22 – 2022-05-25 (×4): 70 mg via SUBCUTANEOUS
  Filled 2022-05-20 (×5): qty 0.7

## 2022-05-20 MED ORDER — VANCOMYCIN HCL 1250 MG/250ML IV SOLN
1250.0000 mg | Freq: Two times a day (BID) | INTRAVENOUS | Status: DC
  Administered 2022-05-21 – 2022-05-24 (×7): 1250 mg via INTRAVENOUS
  Filled 2022-05-20 (×8): qty 250

## 2022-05-20 MED ORDER — IOHEXOL 350 MG/ML SOLN
100.0000 mL | Freq: Once | INTRAVENOUS | Status: AC | PRN
  Administered 2022-05-20: 100 mL via INTRAVENOUS

## 2022-05-20 MED ORDER — VANCOMYCIN HCL 1500 MG/300ML IV SOLN
1500.0000 mg | Freq: Once | INTRAVENOUS | Status: AC
  Administered 2022-05-20: 1500 mg via INTRAVENOUS
  Filled 2022-05-20: qty 300

## 2022-05-20 MED ORDER — METRONIDAZOLE 500 MG/100ML IV SOLN
500.0000 mg | Freq: Two times a day (BID) | INTRAVENOUS | Status: DC
  Administered 2022-05-21 – 2022-05-23 (×6): 500 mg via INTRAVENOUS
  Filled 2022-05-20 (×7): qty 100

## 2022-05-20 MED ORDER — LACTATED RINGERS IV BOLUS (SEPSIS)
1000.0000 mL | Freq: Once | INTRAVENOUS | Status: AC
  Administered 2022-05-20: 1000 mL via INTRAVENOUS

## 2022-05-20 MED ORDER — SODIUM CHLORIDE 0.9 % IV SOLN
2.0000 g | Freq: Once | INTRAVENOUS | Status: AC
  Administered 2022-05-20: 2 g via INTRAVENOUS
  Filled 2022-05-20: qty 12.5

## 2022-05-20 MED ORDER — MORPHINE SULFATE (PF) 4 MG/ML IV SOLN
4.0000 mg | Freq: Once | INTRAVENOUS | Status: AC
  Administered 2022-05-20: 4 mg via INTRAVENOUS
  Filled 2022-05-20: qty 1

## 2022-05-20 MED ORDER — ACETAMINOPHEN 500 MG PO TABS
1000.0000 mg | ORAL_TABLET | Freq: Once | ORAL | Status: AC
  Administered 2022-05-20: 1000 mg via ORAL
  Filled 2022-05-20: qty 2

## 2022-05-20 MED ORDER — ONDANSETRON HCL 4 MG/2ML IJ SOLN
4.0000 mg | Freq: Once | INTRAMUSCULAR | Status: AC
  Administered 2022-05-20: 4 mg via INTRAVENOUS
  Filled 2022-05-20: qty 2

## 2022-05-20 MED ORDER — SODIUM CHLORIDE 0.9 % IV BOLUS
1000.0000 mL | Freq: Once | INTRAVENOUS | Status: AC
  Administered 2022-05-20: 1000 mL via INTRAVENOUS

## 2022-05-20 MED ORDER — ONDANSETRON HCL 4 MG/2ML IJ SOLN: 4.0000 mg | Freq: Four times a day (QID) | INTRAMUSCULAR | Status: DC | PRN

## 2022-05-20 NOTE — ED Notes (Signed)
Dr. Jonelle Sidle @ the bedside.

## 2022-05-20 NOTE — ED Triage Notes (Addendum)
Pt reports having a bump in her pubic hair for 5 days.  Pt reports chills and fever.   Pt reports squeezing it, no drainage.  Pt alert  interpreter on a stick used in triage.

## 2022-05-20 NOTE — Consult Note (Signed)
PHARMACY -  BRIEF ANTIBIOTIC NOTE   Pharmacy has received consult(s) for cefepime and vancomycin from an ED provider.  The patient's profile has been reviewed for ht/wt/allergies/indication/available labs.    One time order(s) placed for  Vancomycin 2500 mg Cefepime 2 gram   Further antibiotics/pharmacy consults should be ordered by admitting physician if indicated.                       Thank you, Dorothe Pea, PharmD, BCPS Clinical Pharmacist   05/20/2022  8:15 PM

## 2022-05-20 NOTE — ED Provider Notes (Signed)
[facility] Provider Note    Event Date/Time   First MD Initiated Contact with Patient 05/20/22 1954     (approximate)   History   Abscess   HPI  Erika Maxwell is a 32 y.o. female here with rash and pain to abdomen. Pt states that for the past week she's had a painful rash to her suprapubic area. It started as a small red pimple that has spread throughout the lower abdomen and is now severely painful. She's had associated nausea, chills, and weakness. No h/o similar issues in the past. No drainage.        Physical Exam   Triage Vital Signs: ED Triage Vitals  Enc Vitals Group     BP 05/20/22 1715 (!) 147/132     Pulse Rate 05/20/22 1715 (!) 146     Resp 05/20/22 1715 (!) 22     Temp 05/20/22 1715 100 F (37.8 C)     Temp Source 05/20/22 1715 Oral     SpO2 05/20/22 1715 96 %     Weight 05/20/22 1711 (!) 310 lb 13.6 oz (141 kg)     Height 05/20/22 1711 5\' 1"  (1.549 m)     Head Circumference --      Peak Flow --      Pain Score 05/20/22 1711 8     Pain Loc --      Pain Edu? --      Excl. in [place]? --     Most recent vital signs: Vitals:   05/20/22 2130 05/20/22 2200  BP: 120/81 115/82  Pulse: (!) 116 (!) 116  Resp: (!) 23 20  Temp:    SpO2: 96% 96%     General: Awake, no distress.  CV:  Good peripheral perfusion. Tachycardic, No murmurs. Resp:  Normal effort. Mild tachypnea noted. Abd:  No distention. Significant erythema and induration to right lower pannus/mons area, with significant induration but no fluctuance. No drainage noted. Other:  Dry MM.   ED Results / Procedures / Treatments   Labs (all labs ordered are listed, but only abnormal results are displayed) Labs Reviewed  BASIC METABOLIC PANEL - Abnormal; Notable for the following components:      Result Value   Sodium 128 (*)    Chloride 93 (*)    Glucose, Bld 385 (*)    Calcium 8.5 (*)    All other components within normal limits  CBC - Abnormal; Notable  for the following components:   WBC 17.8 (*)    All other components within normal limits  LACTIC ACID, PLASMA - Abnormal; Notable for the following components:   Lactic Acid, Venous 2.2 (*)    All other components within normal limits  CULTURE, BLOOD (SINGLE)  LACTIC ACID, PLASMA  HCG, QUANTITATIVE, PREGNANCY  HIV ANTIBODY (ROUTINE TESTING W REFLEX)  PROTIME-INR  CORTISOL-AM, BLOOD  PROCALCITONIN  CBC  COMPREHENSIVE METABOLIC PANEL  HEMOGLOBIN A1C  POC URINE PREG, ED     EKG Sinus tachycardia, VR 145. PR 178, QRS 84, QTc 406. No acute St elevation. No ischemia or infarct.   RADIOLOGY CT A/P: Cellulitis of anterior pelvic wall, no abscess or drainable fluid collection   I also independently reviewed and agree with radiologist interpretations.   PROCEDURES:  Critical Care performed: Yes, see critical care procedure note(s)  .Critical Care  Performed by: Duffy Bruce, MD Authorized by: Duffy Bruce, MD   Critical care provider statement:    Critical care time (minutes):  30   Critical care time was exclusive of:  Separately billable procedures and treating other patients   Critical care was necessary to treat or prevent imminent or life-threatening deterioration of the following conditions:  Cardiac failure, circulatory failure, sepsis and respiratory failure   Critical care was time spent personally by me on the following activities:  Development of treatment plan with patient or surrogate, discussions with consultants, evaluation of patient's response to treatment, examination of patient, ordering and review of laboratory studies, ordering and review of radiographic studies, ordering and performing treatments and interventions, pulse oximetry, re-evaluation of patient's condition and review of old [place] ED: Medications  vancomycin (VANCOCIN) IVPB 1000 mg/200 mL premix (0 mg Intravenous Stopped 05/20/22 2145)    Followed by   vancomycin (VANCOREADY) IVPB 1500 mg/300 mL (1,500 mg Intravenous New Bag/Given 05/20/22 2151)  insulin aspart (novoLOG) injection 0-20 Units (has no administration in time range)  enoxaparin (LOVENOX) injection 70 mg (has no administration in time range)  lactated ringers infusion (has no administration in time range)  ceFEPIme (MAXIPIME) 2 g in sodium chloride 0.9 % 100 mL IVPB (2 g Intravenous Not Given 05/20/22 2235)  metroNIDAZOLE (FLAGYL) IVPB 500 mg (500 mg Intravenous Not Given 05/20/22 2236)  vancomycin (VANCOCIN) IVPB 1000 mg/200 mL premix (1,000 mg Intravenous Not Given 05/20/22 2236)  acetaminophen (TYLENOL) tablet 650 mg (has no administration in time range)    Or  acetaminophen (TYLENOL) suppository 650 mg (has no administration in time range)  ondansetron (ZOFRAN) tablet 4 mg (has no administration in time range)    Or  ondansetron (ZOFRAN) injection 4 mg (has no administration in time range)  ceFEPIme (MAXIPIME) 2 g in sodium chloride 0.9 % 100 mL IVPB (has no administration in time range)  vancomycin (VANCOREADY) IVPB 1250 mg/250 mL (has no administration in time range)  sodium chloride 0.9 % bolus 1,000 mL (0 mLs Intravenous Stopped 05/20/22 2021)  ceFEPIme (MAXIPIME) 2 g in sodium chloride 0.9 % 100 mL IVPB (0 g Intravenous Stopped 05/20/22 2051)  metroNIDAZOLE (FLAGYL) IVPB 500 mg (0 mg Intravenous Stopped 05/20/22 2131)  lactated ringers bolus 1,000 mL (0 mLs Intravenous Stopped 05/20/22 2051)    And  lactated ringers bolus 500 mL (0 mLs Intravenous Stopped 05/20/22 2046)  acetaminophen (TYLENOL) tablet 1,000 mg (1,000 mg Oral Given 05/20/22 2027)  morphine (PF) 4 MG/ML injection 4 mg (4 mg Intravenous Given 05/20/22 2027)  ondansetron (ZOFRAN) injection 4 mg (4 mg Intravenous Given 05/20/22 2027)  iohexol (OMNIPAQUE) 350 MG/ML injection 100 mL (100 mLs Intravenous Contrast Given 05/20/22 2059)     IMPRESSION / MDM / ASSESSMENT AND PLAN / ED COURSE  I reviewed the triage vital  signs and the nursing notes.                              Differential diagnosis includes, but is not limited to, sepsis from cellulitis, abscess, nec fasc, UTI, intra-abd infection spreading to skin  Patient's presentation is most consistent with acute presentation with potential threat to life or bodily function.  The patient is on the cardiac monitor to evaluate for evidence of arrhythmia and/or significant heart rate changes.  32 yo F with PMHx obesity here with sepsis 2/2 abdominal wall cellulitis. Pt febrile, tachycardic with significant leukocytosis and mild lactic acidosis on lab work. Renal function is normal. CT scan obtained, reviewed by me and shows no evidence  of deeper infection or abscess. Pt given 30 cc/kg fluids, broad-spectrum ABX, and will admit to medicine.  FINAL CLINICAL IMPRESSION(S) / ED DIAGNOSES   Final diagnoses:  Sepsis due to cellulitis [facility])     Rx / DC Orders   ED Discharge Orders     None        Note:  This document was prepared using Dragon voice recognition software and may include unintentional dictation errors.   Shaune Pollack, MD 05/20/22 [phone]

## 2022-05-20 NOTE — ED Notes (Signed)
Pt to CT

## 2022-05-20 NOTE — Consult Note (Signed)
CODE SEPSIS - PHARMACY COMMUNICATION  **Broad Spectrum Antibiotics should be administered within 1 hour of Sepsis diagnosis**  Time Code Sepsis Called/Page Received: 1958  Antibiotics Ordered: cefepime, vancomycin, metronidazole  Time of 1st antibiotic administration: 2021        Dorothe Pea, PharmD, BCPS Clinical Pharmacist   05/20/2022  9:12 PM

## 2022-05-20 NOTE — H&P (Signed)
History and Physical    Patient: Erika Maxwell TGG:[id number] DOB: October 29, 1990 DOA: 05/20/2022 DOS: the patient was seen and examined on 05/20/2022 PCP: Pcp, No  Patient coming from: Home  Chief Complaint:  Chief Complaint  Patient presents with   Abscess   HPI: Erika Maxwell is a 32 y.o. female with medical history significant of history of gallstones, morbid obesity, gestational diabetes who presents to the ER with lower abdominal wall redness, swelling, fever and severe pain.  Patient appears to have significant panniculitis.  Patient also meets sepsis criteria with leukocytosis and heart rate.  She is being admitted with sepsis due to panniculitis.  Review of Systems: As mentioned in the history of present illness. All other systems reviewed and are negative. History reviewed. No pertinent past medical history. Past Surgical History:  Procedure Laterality Date   gallstone     Social History:  reports that she has never smoked. She has never used smokeless tobacco. She reports that she does not drink alcohol and does not use drugs.  No Known Allergies  History reviewed. No pertinent family history.  Prior to Admission medications   Medication Sig Start Date End Date Taking? Authorizing Provider  Prenatal Vit-Fe Fumarate-FA (PRENATAL MULTIVITAMIN) TABS tablet Take 1 tablet by mouth daily at 12 noon.    [provider]    Physical Exam: Vitals:   05/20/22 1715 05/20/22 2010 05/20/22 2030 05/20/22 2130  BP: (!) 147/132 (!) 145/70 100/62 120/81  Pulse: (!) 146 (!) 137 (!) 122 (!) 116  Resp: (!) 22 (!) 25 17 (!) 23  Temp: 100 F (37.8 C) 99.6 F (37.6 C)    TempSrc: Oral Oral    SpO2: 96% 98% 99% 96%  Weight:      Height:       Constitutional: Morbidly obese, NAD, calm, comfortable Eyes: PERRL, lids and conjunctivae normal ENMT: Mucous membranes are moist. Posterior pharynx clear of any exudate or lesions.Normal dentition.  Neck: normal,  supple, no masses, no thyromegaly Respiratory: clear to auscultation bilaterally, no wheezing, no crackles. Normal respiratory effort. No accessory muscle use.  Cardiovascular: Sinus tachycardia, no murmurs / rubs / gallops. No extremity edema. 2+ pedal pulses. No carotid bruits.  Abdomen: no tenderness, no masses palpated. No hepatosplenomegaly. Bowel sounds positive.  Musculoskeletal: Good range of motion, no joint swelling or tenderness, Skin: Lower abdominal pain redness, swelling, evidence of panniculitis, no induration Neurologic: CN 2-12 grossly intact. Sensation intact, DTR normal. Strength 5/5 in all 4.  Psychiatric: Normal judgment and insight. Alert and oriented x 3. Normal mood  Data Reviewed:  Temperature 100, blood pressure 147/132, pulse 146, respiratory rate of 20 oxygen sat 96 on room air.  White count 17.6, hemoglobin of 14, sodium 128 potassium 3.7, chloride 93.  Calcium 8.5 glucose 385.  Lactic acid 2.2.  CT abdomen pelvis showed findings suspicious for cellulitis of the anterior pelvic wall no drainable fluid or abscess and no bowel obstruction.  Assessment and Plan:  #1 sepsis due to cellulitis: Patient will be admitted.  Blood cultures obtained.  Initiate vancomycin and cefepime.  Follow-up sepsis protocol.  IV fluids and supportive care.  #2 hyperglycemia: Patient denies being a diabetic.  She had documented gestational diabetes which is diet controlled.  Will check A1c.  Initiate sliding scale insulin.  #3 morbid obesity: Dietary counseling  #4 hyponatremia: May be pseudohyponatremia from hyperglycemia.  Continue to monitor sodium level.       Advance Care Planning:  Code Status: Full Code   Consults: None  Family Communication: No family at bedside  Severity of Illness: The appropriate patient status for this patient is INPATIENT. Inpatient status is judged to be reasonable and necessary in order to provide the required intensity of service to ensure the  patient's safety. The patient's presenting symptoms, physical exam findings, and initial radiographic and laboratory data in the context of their chronic comorbidities is felt to place them at high risk for further clinical deterioration. Furthermore, it is not anticipated that the patient will be medically stable for discharge from the hospital within 2 midnights of admission.   * I certify that at the point of admission it is my clinical judgment that the patient will require inpatient hospital care spanning beyond 2 midnights from the point of admission due to high intensity of service, high risk for further deterioration and high frequency of surveillance required.*  AuthorBarbette Merino, MD 05/20/2022 9:47 PM  For on call review [email or web address].

## 2022-05-20 NOTE — ED Provider Triage Note (Signed)
Emergency Medicine Provider Triage Evaluation Note  Ajahnae Rathgeber , a 32 y.o. female  was evaluated in triage.  Pt complains of possible labial abscess. Lesion around labia that is worsening. Fever, elevated HR, tachypnea. No HX recurring skin infections.  Review of Systems  Positive: Fever, labia skin lesion Negative: Urinary symptoms  Physical Exam  BP (!) 147/132 (BP Location: Left Arm)   Pulse (!) 146   Temp 100 F (37.8 C) (Oral)   Resp (!) 22   Ht 5\' 1"  (1.549 m)   Wt (!) 141 kg   SpO2 96%   BMI 58.73 kg/m  Gen:   Awake, no distress   Resp:  Normal effort  MSK:   Moves extremities without difficulty  Other:    Medical Decision Making  Medically screening exam initiated at 6:23 PM.  Appropriate orders placed.  Harrison Mons Alas Hennie Duos was informed that the remainder of the evaluation will be completed by another provider, this initial triage assessment does not replace that evaluation, and the importance of remaining in the ED until their evaluation is complete.  Labs, lactic, IV, fluids. Patient meets sepsis criteria. First nurse aware. Will start abx when roomed.    Darletta Moll, PA-C 05/20/22 1825

## 2022-05-20 NOTE — Progress Notes (Signed)
Pharmacy Antibiotic Note  Erika Maxwell is a 32 y.o. female admitted on 05/20/2022 with cellulitis.  Pharmacy has been consulted for Cefepime & Vancomycin dosing for 7 days.  Plan: Cefepime 2 gm q8hr per indication & renal fxn.  Pt given Vancomycin 2500 mg once. Vancomycin 1250 mg IV Q 12 hrs. Goal AUC 400-550. Expected AUC: 491.7 SCr used: 0.74, Vd used: 0.5, BMI: 58.6  Pharmacy will continue to follow and will adjust abx dosing whenever warranted.  Temp (24hrs), Avg:99.8 F (37.7 C), Min:99.6 F (37.6 C), Max:100 F (37.8 C)   Recent Labs  Lab 05/20/22 1715 05/20/22 1812  WBC 17.8*  --   CREATININE 0.74  --   LATICACIDVEN  --  2.2*    Estimated Creatinine Clearance: 134.4 mL/min (by C-G formula based on SCr of 0.74 mg/dL).    No Known Allergies  Antimicrobials this admission: 1/23 Cefepime >> x 7 days 1/23 Vancomycin >> x 7 days 1/23 Flagyl >> x 7 days  Microbiology results: 1/23 BCx: Pending  Thank you for allowing pharmacy to be a part of this patient's care.  Renda Rolls, PharmD, [facility] 05/20/2022 9:56 PM

## 2022-05-20 NOTE — Sepsis Progress Note (Signed)
Following for sepsis monitoring

## 2022-05-20 NOTE — ED Notes (Signed)
Pt back from CT

## 2022-05-20 NOTE — ED Notes (Signed)
Skin marker utilized to mark the patients skin.

## 2022-05-20 NOTE — Progress Notes (Signed)
PHARMACIST - PHYSICIAN COMMUNICATION  CONCERNING:  Enoxaparin (Lovenox) for DVT Prophylaxis    RECOMMENDATION: Patient was prescribed enoxaprin 40mg  q24 hours for VTE prophylaxis.   Filed Weights   05/20/22 1711  Weight: (!) 141 kg (310 lb 13.6 oz)    Body mass index is 58.73 kg/m.  Estimated Creatinine Clearance: 134.4 mL/min (by C-G formula based on SCr of 0.74 mg/dL).   Based on [place] patient is candidate for enoxaparin 0.5mg /kg TBW SQ every 24 hours based on BMI being >30.  DESCRIPTION: Pharmacy has adjusted enoxaparin dose per [facility] policy.  Patient is now receiving enoxaparin 0.5 mg/kg every 24 hours   Renda Rolls, PharmD, [facility] 05/20/2022 10:05 PM

## 2022-05-21 DIAGNOSIS — A419 Sepsis, unspecified organism: Principal | ICD-10-CM

## 2022-05-21 LAB — CBC
HCT: 34.3 % — ABNORMAL LOW (ref 36.0–46.0)
Hemoglobin: 11.6 g/dL — ABNORMAL LOW (ref 12.0–15.0)
MCH: 28.6 pg (ref 26.0–34.0)
MCHC: 33.8 g/dL (ref 30.0–36.0)
MCV: 84.7 fL (ref 80.0–100.0)
Platelets: 200 10*3/uL (ref 150–400)
RBC: 4.05 MIL/uL (ref 3.87–5.11)
RDW: 12.5 % (ref 11.5–15.5)
WBC: 14.8 10*3/uL — ABNORMAL HIGH (ref 4.0–10.5)
nRBC: 0 % (ref 0.0–0.2)

## 2022-05-21 LAB — COMPREHENSIVE METABOLIC PANEL
ALT: 18 U/L (ref 0–44)
AST: 17 U/L (ref 15–41)
Albumin: 2.9 g/dL — ABNORMAL LOW (ref 3.5–5.0)
Alkaline Phosphatase: 76 U/L (ref 38–126)
Anion gap: 10 (ref 5–15)
BUN: 11 mg/dL (ref 6–20)
CO2: 23 mmol/L (ref 22–32)
Calcium: 7.7 mg/dL — ABNORMAL LOW (ref 8.9–10.3)
Chloride: 99 mmol/L (ref 98–111)
Creatinine, Ser: 0.53 mg/dL (ref 0.44–1.00)
GFR, Estimated: >60 mL/min (ref >60)
Glucose, Bld: 289 mg/dL — ABNORMAL HIGH (ref 70–99)
Potassium: 3.2 mmol/L — ABNORMAL LOW (ref 3.5–5.1)
Sodium: 132 mmol/L — ABNORMAL LOW (ref 135–145)
Total Bilirubin: 1 mg/dL (ref 0.3–1.2)
Total Protein: 6.8 g/dL (ref 6.5–8.1)

## 2022-05-21 LAB — GLUCOSE, CAPILLARY
Glucose-Capillary: 234 mg/dL — ABNORMAL HIGH (ref 70–99)
Glucose-Capillary: 207 mg/dL — ABNORMAL HIGH (ref 70–99)
Glucose-Capillary: 276 mg/dL — ABNORMAL HIGH (ref 70–99)
Glucose-Capillary: 245 mg/dL — ABNORMAL HIGH (ref 70–99)

## 2022-05-21 LAB — HIV ANTIBODY (ROUTINE TESTING W REFLEX): HIV Screen 4th Generation wRfx: NONREACTIVE

## 2022-05-21 LAB — HEMOGLOBIN A1C
Hgb A1c MFr Bld: 9.3 % — ABNORMAL HIGH (ref 4.8–5.6)
Mean Plasma Glucose: 220.21 mg/dL

## 2022-05-21 LAB — MRSA NEXT GEN BY PCR, NASAL: MRSA by PCR Next Gen: NOT DETECTED

## 2022-05-21 LAB — PHOSPHORUS: Phosphorus: 1.8 mg/dL — ABNORMAL LOW (ref 2.5–4.6)

## 2022-05-21 LAB — PROTIME-INR
INR: 1.5 — ABNORMAL HIGH (ref 0.8–1.2)
Prothrombin Time: 17.8 seconds — ABNORMAL HIGH (ref 11.4–15.2)

## 2022-05-21 LAB — CORTISOL-AM, BLOOD: Cortisol - AM: 19.9 ug/dL (ref 6.7–22.6)

## 2022-05-21 LAB — MAGNESIUM: Magnesium: 2 mg/dL (ref 1.7–2.4)

## 2022-05-21 LAB — PROCALCITONIN: Procalcitonin: 4.32 ng/mL

## 2022-05-21 LAB — CBG MONITORING, ED: Glucose-Capillary: 252 mg/dL — ABNORMAL HIGH (ref 70–99)

## 2022-05-21 MED ORDER — POTASSIUM CHLORIDE CRYS ER 20 MEQ PO TBCR
40.0000 meq | EXTENDED_RELEASE_TABLET | Freq: Once | ORAL | Status: AC
  Administered 2022-05-21: 40 meq via ORAL
  Filled 2022-05-21: qty 2

## 2022-05-21 MED ORDER — POTASSIUM PHOSPHATES 15 MMOLE/5ML IV SOLN
30.0000 mmol | Freq: Once | INTRAVENOUS | Status: AC
  Administered 2022-05-21: 30 mmol via INTRAVENOUS
  Filled 2022-05-21: qty 10

## 2022-05-21 MED ORDER — MORPHINE SULFATE (PF) 2 MG/ML IV SOLN
2.0000 mg | Freq: Once | INTRAVENOUS | Status: AC
  Administered 2022-05-21: 2 mg via INTRAVENOUS
  Filled 2022-05-21: qty 1

## 2022-05-21 MED ORDER — KETOROLAC TROMETHAMINE 15 MG/ML IJ SOLN
15.0000 mg | Freq: Three times a day (TID) | INTRAMUSCULAR | Status: DC
  Administered 2022-05-21 – 2022-05-25 (×12): 15 mg via INTRAVENOUS
  Filled 2022-05-21 (×12): qty 1

## 2022-05-21 MED ORDER — OXYCODONE HCL 5 MG PO TABS
5.0000 mg | ORAL_TABLET | Freq: Four times a day (QID) | ORAL | Status: DC | PRN
  Administered 2022-05-21 – 2022-05-25 (×7): 5 mg via ORAL
  Filled 2022-05-21 (×7): qty 1

## 2022-05-21 MED ORDER — INSULIN ASPART 100 UNIT/ML IJ SOLN
0.0000 [IU] | Freq: Every day | INTRAMUSCULAR | Status: DC
  Administered 2022-05-21: 2 [IU] via SUBCUTANEOUS
  Administered 2022-05-22 – 2022-05-24 (×2): 3 [IU] via SUBCUTANEOUS
  Filled 2022-05-21 (×3): qty 1

## 2022-05-21 MED ORDER — LIVING WELL WITH DIABETES BOOK - IN SPANISH
Freq: Once | Status: AC
  Filled 2022-05-21: qty 1

## 2022-05-21 MED ORDER — INSULIN ASPART 100 UNIT/ML IJ SOLN
0.0000 [IU] | Freq: Three times a day (TID) | INTRAMUSCULAR | Status: DC
  Administered 2022-05-21 – 2022-05-22 (×3): 11 [IU] via SUBCUTANEOUS
  Administered 2022-05-23: 4 [IU] via SUBCUTANEOUS
  Administered 2022-05-23: 7 [IU] via SUBCUTANEOUS
  Administered 2022-05-23 – 2022-05-24 (×4): 4 [IU] via SUBCUTANEOUS
  Administered 2022-05-25: 7 [IU] via SUBCUTANEOUS
  Administered 2022-05-25: 4 [IU] via SUBCUTANEOUS
  Filled 2022-05-21 (×11): qty 1

## 2022-05-21 MED ORDER — MORPHINE SULFATE (PF) 2 MG/ML IV SOLN
2.0000 mg | INTRAVENOUS | Status: DC | PRN
  Administered 2022-05-21 – 2022-05-22 (×2): 2 mg via INTRAVENOUS
  Filled 2022-05-21 (×4): qty 1

## 2022-05-21 NOTE — Progress Notes (Signed)
[facility] Progress Note  Patient: Erika Maxwell    DXA:[id number]  DOA: 05/20/2022     Date of Service: the patient was seen and examined on 05/21/2022  Chief Complaint  Patient presents with   Abscess   Brief hospital course:  Amila Callies is a 32 y.o. female with medical history significant of history of gallstones, morbid obesity, gestational diabetes who presents to the ER with lower abdominal wall redness, swelling, fever and severe pain.  Patient appears to have significant panniculitis.  Patient also meets sepsis criteria with leukocytosis and heart rate.  She is being admitted with sepsis due to panniculitis.    Assessment and Plan:  # Sepsis due to cellulitis:  Fever, tach, leukocytosis, tachypnea, elevated lactic acid, cellulitis Continue vancomycin, cefepime and Flagyl for now, we will de-escalate antibiotics after some improvement Pharmacy consulted for dosing and trough monitoring Continue Tylenol and oxycodone as needed for pain control Started Toradol 15 mg every 8 hourly for 5 days to decrease inflammation Follow blood cultures    # hyperglycemia: Patient denies being a diabetic.  She had documented gestational diabetes which is diet controlled.   HbA1c 9.3 Started NovoLog sliding scale, monitor CBG Continue diabetic diet    # morbid obesity: BMI 59, calorie restricted diet and daily exercise advised to lose body weight    # hyponatremia: May be pseudohyponatremia from hyperglycemia.  Continue to monitor sodium level.  # Hypokalemia, potassium repleted.  Monitor and replete as needed # Hypophosphatemia, Phos repleted.  Monitor and replete as needed.   Body mass index is 58.73 kg/m.  Interventions:       Diet: Carb modified diet DVT Prophylaxis: Subcutaneous Lovenox   Advance goals of care discussion: Full code  Family Communication: family was not present at bedside, at the time of interview.  The pt provided  permission to discuss medical plan with the family. Opportunity was given to ask question and all questions were answered satisfactorily.   Disposition:  Pt is from Home, admitted with sepsis due to cellulitis, still on IV Abx , which precludes a safe discharge. Discharge to Home, when stable, may need IV antibiotics for 4 to 5 days.  Subjective: No significant overnight events, patient is having significant pain in the lower part of the abdominal wall due to cellulitis.  Denies any other active issues.  Physical Exam: General: NAD, lying comfortably Appear in no distress, affect appropriate Eyes: PERRLA ENT: Oral Mucosa Clear, moist  Neck: no JVD,  Cardiovascular: S1 and S2 Present, no Murmur,  Respiratory: good respiratory effort, Bilateral Air entry equal and Decreased, no Crackles, no wheezes Abdomen: Bowel Sound present, Soft, lower abdominal wall tenderness erythematous, mild serosanguineous discharge noticed Skin: no rashes Extremities: no Pedal edema, no calf tenderness Neurologic: without any new focal findings Gait not checked due to patient safety concerns  Vitals:   05/21/22 0813 05/21/22 0913 05/21/22 1223 05/21/22 1522  BP: 119/83   109/69  Pulse: (!) 111   (!) 104  Resp: 17   18  Temp:  (!) 100.4 F (38 C) 98.9 F (37.2 C) 98.3 F (36.8 C)  TempSrc:  Oral Oral Oral  SpO2: 98%   98%  Weight:      Height:        Intake/Output Summary (Last 24 hours) at 05/21/2022 1541 Last data filed at 05/21/2022 0610 Gross per 24 hour  Intake 1900 ml  Output --  Net 1900 ml   [employer]  05/20/22 1711  Weight: (!) 141 kg    Data Reviewed: I have personally reviewed and interpreted daily labs, tele strips, imagings as discussed above. I reviewed all nursing notes, pharmacy notes, vitals, pertinent old records I have discussed plan of care as described above with RN and patient/family.  CBC: Recent Labs  Lab 05/20/22 1715 05/21/22 0301  WBC 17.8* 14.8*  HGB  14.0 11.6*  HCT 41.7 34.3*  MCV 84.2 84.7  PLT 227 767   Basic Metabolic Panel: Recent Labs  Lab 05/20/22 1715 05/21/22 0301  NA 128* 132*  K 3.7 3.2*  CL 93* 99  CO2 23 23  GLUCOSE 385* 289*  BUN 12 11  CREATININE 0.74 0.53  CALCIUM 8.5* 7.7*  MG  --  2.0  PHOS  --  1.8*    Studies: CT ABDOMEN PELVIS W CONTRAST  Result Date: 05/20/2022 CLINICAL DATA:  Abdominal pain. EXAM: CT ABDOMEN AND PELVIS WITH CONTRAST TECHNIQUE: Multidetector CT imaging of the abdomen and pelvis was performed using the standard protocol following bolus administration of intravenous contrast. RADIATION DOSE REDUCTION: This exam was performed according to the departmental dose-optimization program which includes automated exposure control, adjustment of the mA and/or kV according to patient size and/or use of iterative reconstruction technique. CONTRAST:  146mL OMNIPAQUE IOHEXOL 350 MG/ML SOLN COMPARISON:  None Available. FINDINGS: Lower chest: The visualized lung bases are clear. No intra-abdominal free air or free fluid. Hepatobiliary: Fatty liver. No biliary dilatation. Cholecystectomy. No retained calcified stone noted in the central CBD. Pancreas: Unremarkable. No pancreatic ductal dilatation or surrounding inflammatory changes. Spleen: Normal in size without focal abnormality. Adrenals/Urinary Tract: The adrenal glands unremarkable. The kidneys, visualized ureters, and urinary bladder appear unremarkable. Stomach/Bowel: There is no bowel obstruction or active inflammation. The appendix is normal. Vascular/Lymphatic: The abdominal aorta and IVC are unremarkable. No portal venous gas. There is no adenopathy. Reproductive: The uterus is anteverted. An intrauterine device is noted which appears displaced inferiorly. Recommend gynecology referral for repositioning. No adnexal masses. Other: There is mild skin thickening diffuse subcutaneous edema of the anterior pelvic wall suspicious for cellulitis. Correlation with  clinical exam recommended. No drainable fluid collection or abscess. Musculoskeletal: Bilateral L5 pars defects. No listhesis. No acute osseous pathology. IMPRESSION: 1. Findings suspicious for cellulitis of the anterior pelvic wall. No drainable fluid collection or abscess. 2. No bowel obstruction. Normal appendix. 3. Fatty liver. Electronically Signed   By: Anner Crete M.D.   On: 05/20/2022 21:12    Scheduled Meds:  enoxaparin (LOVENOX) injection  0.5 mg/kg Subcutaneous Q24H   insulin aspart  0-20 Units Subcutaneous TID WC   ketorolac  15 mg Intravenous Q8H   living well with diabetes book- in spanish   Does not apply Once   Continuous Infusions:  ceFEPime (MAXIPIME) IV     ceFEPime (MAXIPIME) IV 2 g (05/21/22 1223)   lactated ringers 100 mL/hr at 05/21/22 1008   metronidazole 500 mg (05/21/22 1010)   vancomycin     vancomycin Stopped (05/21/22 0929)   PRN Meds: acetaminophen **OR** acetaminophen, ondansetron **OR** ondansetron (ZOFRAN) IV, oxyCODONE  Time spent: 50 minutes  Author: Val Riles. MD [facility] 05/21/2022 3:41 PM  To reach On-call, see care teams to locate the attending and reach out to them via [email or web address]. If 7PM-7AM, please contact night-coverage If you still have difficulty reaching the attending provider, please page the [facility] (Director on Call) for [facility] on amion for assistance.

## 2022-05-21 NOTE — Discharge Instructions (Signed)
Contar carbohidratos y la diabetes  Por qu es importante el conteo de carbohidratos?  ? Contar las porciones de carbohidratos ayuda a Freight forwarder nivel de glucosa (azcar) en su sangre para que se sienta mejor.  ? El equilibrio [employer] carbohidratos que come y Best boy determina el nivel de glucosa que tendr en la sangre despus de comer.  ? Contar carbohidratos tambin le ayudar a planificar sus comidas.   Qu alimentos contienen carbohidratos?  Entre los alimentos con carbohidratos se incluyen:  ? Panes, galletas saladas y cereales  ? Pastas, arroz y granos  ? Vegetales (verduras) con almidn, como papas, elote (maz o choclo) y chcharos (guisantes o arvejas)  ? Frijoles (habichuelas) y legumbres  ? Leche, leche de soya y yogur  ? Lambert Mody y jugos de fruta  ? Dulces como pasteles, galletas, helados, mermeladas y jaleas   Porciones de carbohidratos  Al planificar comidas para la diabetes, recuerde que un alimento con 1 porcin de carbohidratos contiene aproximadamente 15 gramos de carbohidratos:  ? Revise el tamao de las porciones con tazas y cucharas de medir o con una pesa de alimentos.  ? Lea los Datos de Nutricin en las etiquetas de los alimentos para saber cuntos gramos de carbohidratos contienen los alimentos que come.   Los Estée Lauder de este folleto muestran porciones que contienen cerca de 15 gramos de carbohidratos. Copyright Academy of Nutrition and Dietetics. This handout may be duplicated for client education. Carbohydrate Counting for Diabetes (Spanish) - 2   Consejos para planificar sus comidas  ? Un Plan de Alimentacin indica cuntas porciones de carbohidratos consumir en sus comidas y refrigerios (snacks). Para muchos adultos es adecuado comer 3 a 5 porciones de carbohidratos en cada comida y de 1 a 2 porciones de carbohidratos, en cada refrigerio.  ? En un Plan de Alimentacin diaria saludable, la mayora de los carbohidratos provienen de:  o  Al menos 6 porciones de frutas y vegetales sin almidn  o Al menos 6 porciones de Engineer, manufacturing, frijoles y Photographer con almidn, con al menos 3 de estas porciones de granos integrales (enteros)  o Al menos 2 porciones de Air traffic controller o productos lcteos  ? Revise regularmente su nivel de glucosa en la sangre. Esto puede indicarle si necesita ajustar las horas a las que consume carbohidratos.  ? Comer alimentos que contienen [place], como granos [place], y comer muy pocos alimentos salados es bueno para su salud.  ? Coma 4 a 6 onzas de carne u otros alimentos con protenas (como hamburguesas de soya) cada da. Elija fuentes de protena bajas en grasa, como carne de res y de cerdo bajas en grasa, pollo, pescado, queso bajo en grasa o alimentos vegetarianos como la soya.  ? Coma algunas grasas saludables, como aceite de [place], de canola y nueces.  ? Coma muy pocas grasas saturadas. Estas grasas no son saludables y se Occupational psychologist, la crema y las carnes con mucha grasa, como el tocino (tocineta) y las salchichas o Photographer.  ? Coma muy pocas o nada de grasas trans. Estas grasas no son saludables y se encuentran en todos los alimentos que contienen aceites "parcialmente hidrogenados" en su lista de ingredientes.   Consejos para leer etiquetas  En los Datos de Nutricin de las etiquetas aparece una lista con el total de gramos de carbohidratos en una porcin estndar. La porcin estndar puede ser mayor o menor que 1 porcin de carbohidratos. Para saber cuntas porciones de carbohidratos hay  en un alimento:  ? Primero mire el tamao de la porcin estndar de la etiqueta.  ? Luego verifique el total de gramos de carbohidratos. Esta es la cantidad de carbohidratos en 1 porcin estndar. Divida el total de gramos de carbohidratos por 15. Este nmero equivale al nmero de porciones de carbohidratos en 1 porcin estndar. Recuerde: 1 porcin de carbohidratos equivale a 15 gramos de carbohidratos.  ? Nota:  Puede ignorar los gramos de [employer] Datos de Nutricin, ya que estn incluidos en el total de gramos de carbohidratos.  Copyright Academy of Nutrition and Dietetics. This handout may be duplicated for client education. Carbohydrate Counting for Diabetes (Spanish) - 3   Listas de alimentos para el conteo de carbohidratos  1 porcin = cerca de 15 gramos de carbohidratos  Almidones  ? 1 rebanada de pan (1 onza)  ? 1 tortilla (6 pulgadas)  ?  rosca de pan (bagel) grande (1 onza)  ? 2 tortillas para taco (5 pulgadas)  ?  pan para hamburguesa o para salchicha (hot dog) (3/4 onza)  ?  taza de cereal listo para comer sin endulzar  ?  taza de cereal cocido  ? 1 taza de sopa a base de caldo  ? 4-6 galletitas saladas  ? ? taza de pasta o arroz (cocidos)  ?  taza de frijoles, chcharos, granos de elote, camotes (batatas, boniatos), calabaza (zapallo), pur de papas o papas hervidas (cocidos)  ?  papa grande asada (3 onzas)  ?  onza de pretzels, papitas o totopos (tortilla chips)  ? 3 tazas de palomitas de maz (popcorn) (ya preparadas)   Lambert Mody  ? 1 fruta fresca pequea ( a 1 taza)  ?  taza de fruta enlatada o congelada  ? 17 uvas pequeas (3 onzas)  ? 1 taza de meln, bayas (moras)  ?  vaso de jugo de fruta  ? 2 cucharadas de frutas secas (arndanos azules/blueberries, cerezas, arndanos rojos/cranberries, frutas surtidas, uvas pasas/pasitas)  Leche  ? 1 taza de [employer] o reducida en grasa  ? 1 taza de leche de soya  ? ? taza de yogur descremado endulzado con un edulcorante sin azcar (6 onzas)   Dulces y postres  ? pastel cuadrado de 2 pulgadas (sin betn/cobertura)  ? 2 galletitas dulces (? onzas)  ?  taza de helado o yogur congelado  ?  taza de sorbete (sherbet) o nieve (sorbet)  ? 1 cucharada de jarabe (sirope), mermelada, jalea, azcar o miel  ? 2 cucharadas de jarabe bajo en caloras  Copyright Academy of Nutrition and Dietetics. This handout may be  duplicated for client education. Carbohydrate Counting for Diabetes (Spanish) - 4   Otros alimentos  ? Cuente 1 taza de vegetales crudos o  taza de vegetales sin almidn, cocidas, como porciones de alimentos con cero (0) carbohidratos o "sin restriccin." Si come 3 o ms porciones en una comida, cuntelas como 1 porcin de carbohidratos.  ? Los alimentos que contienen menos de 20 caloras en cada porcin tambin pueden contarse como porciones con cero carbohidratos o alimentos "sin restriccin."  ? Cuente 1 taza de guiso (estofado) u otros alimentos combinados como 2 porciones de carbohidratos.   Notas: Copyright Academy of Nutrition and Dietetics. This handout may be duplicated for client education. Carbohydrate Counting for Diabetes (Spanish) - 5   Contar carbohidratos y la diabetes: Ejemplo de men para 1 da Desayuno  1 pltano/banana pequeo (1 carbohidrato)   taza de hojuelas de maz (cornflakes) (1  carbohidrato)  1 taza de leche descremada o baja en grasa (1 carbohidrato)  1 rebanada de pan de trigo integral (1 carbohidrato)  1 cucharadita de margarina   Almuerzo  2 onzas de rebanadas de [place]  2 rebanadas de pan de trigo integral (2 carbohidratos)  2 hojas de Acupuncturist  4 palitos de apio  4 palitos de zanahoria  1 Programme researcher, broadcasting/film/video (1 carbohidrato)  1 taza de leche descremada o baja en grasa (1 carbohidrato)   Refrigerio  2 cucharadas de uvas pasas/pasitas (1 carbohidrato)   onzas de mini pretzels sin sal (1 carbohidrato)   Cena  3 onzas de carne asada de res, magra   papa grande asada (2 carbohidratos)  1 cucharada de crema agria reducida en grasa   taza de ejotes/habichuelas verdes/chauchas  1 taza de ensalada de vegetales  1 cucharada de aderezo para ensaladas reducido en caloras  1 panecillo de trigo integral (1 carbohidrato)  1 cucharadita de margarina  1 taza de bolitas de meln (1 carbohidrato)   Refrigerio  6 onzas de yogur de frutas bajo en grasa, sin azcar (1  carbohidrato)  2 cucharadas de nueces sin sal

## 2022-05-21 NOTE — Progress Notes (Signed)
Brief Nutrition Follow-Up Note  RD received consult for new DM diagnosis. Noted DM coordinator and [facility] consults pending. Pt is uninsured.   Case discussed with RN; pt is aware of DM diagnosis. Pt is spanish speaking and requires interpretor.   Visited pt in room. Pt sleeping soundly at time of visit. Interpretor not available at this time. RD to follow-up again tomorrow for in person education. RD provided "Carbohydrate Counting for People With Diabetes" handout from [facility] Nutrition Care Manual; attached to AVS/ discharge instructions. Unable to refer pt to [place]'s Nutrition and Diabetes Education Services due to lack of insurance.   Loistine Chance, RD, LDN, [place] Registered Dietitian II Certified Diabetes Care and Education Specialist Please refer to [facility] for RD and/or RD on-call/weekend/after hours pager

## 2022-05-21 NOTE — Inpatient Diabetes Management (Signed)
Inpatient Diabetes Program Recommendations  AACE/ADA: New Consensus Statement on Inpatient Glycemic Control (2015)  Target Ranges:  Prepandial:   less than 140 mg/dL      Peak postprandial:   less than 180 mg/dL (1-2 hours)      Critically ill patients:  140 - 180 mg/dL   Lab Results  Component Value Date   GLUCAP 207 (H) 05/21/2022   HGBA1C 9.3 (H) 05/20/2022    Latest Reference Range & Units 05/21/22 07:36 05/21/22 08:53 05/21/22 11:47  Glucose-Capillary 70 - 99 mg/dL 252 (H) 234 (H) 207 (H)  (H): Data is abnormally high  Diabetes history: hx GD  Outpatient Diabetes medications: None Current orders for Inpatient glycemic control: Novolog 0-20 units tid  Inpatient Diabetes Program Recommendations:   Please consider: -Semglee 20 units qd (0.2 units/kg x 141 kg = 28 units) -Add hs 0-5 units Novolog correction   Noted A1c now 9.3. Ordered Living well With diabetes booklet in Spanish, consult to dietician, and [facility] consult regarding medications.  Will follow while inpatient.  Thank you, Nani Gasser. Fielding Mault, RN, MSN, CDE  Diabetes Coordinator Inpatient Glycemic Control Team Team Pager [phone] (8am-5pm) 05/21/2022 12:42 PM

## 2022-05-22 LAB — CBC
HCT: 30.2 % — ABNORMAL LOW (ref 36.0–46.0)
Hemoglobin: 10.3 g/dL — ABNORMAL LOW (ref 12.0–15.0)
MCH: 28.4 pg (ref 26.0–34.0)
MCHC: 34.1 g/dL (ref 30.0–36.0)
MCV: 83.2 fL (ref 80.0–100.0)
Platelets: 226 10*3/uL (ref 150–400)
RBC: 3.63 MIL/uL — ABNORMAL LOW (ref 3.87–5.11)
RDW: 12.6 % (ref 11.5–15.5)
WBC: 10.9 10*3/uL — ABNORMAL HIGH (ref 4.0–10.5)
nRBC: 0 % (ref 0.0–0.2)

## 2022-05-22 LAB — MAGNESIUM: Magnesium: 2.1 mg/dL (ref 1.7–2.4)

## 2022-05-22 LAB — GLUCOSE, CAPILLARY
Glucose-Capillary: 266 mg/dL — ABNORMAL HIGH (ref 70–99)
Glucose-Capillary: 257 mg/dL — ABNORMAL HIGH (ref 70–99)
Glucose-Capillary: 263 mg/dL — ABNORMAL HIGH (ref 70–99)

## 2022-05-22 LAB — BASIC METABOLIC PANEL
Anion gap: 7 (ref 5–15)
BUN: 9 mg/dL (ref 6–20)
CO2: 21 mmol/L — ABNORMAL LOW (ref 22–32)
Calcium: 7.6 mg/dL — ABNORMAL LOW (ref 8.9–10.3)
Chloride: 104 mmol/L (ref 98–111)
Creatinine, Ser: 0.52 mg/dL (ref 0.44–1.00)
GFR, Estimated: >60 mL/min (ref >60)
Glucose, Bld: 267 mg/dL — ABNORMAL HIGH (ref 70–99)
Potassium: 3.2 mmol/L — ABNORMAL LOW (ref 3.5–5.1)
Sodium: 132 mmol/L — ABNORMAL LOW (ref 135–145)

## 2022-05-22 LAB — MISC LABCORP TEST (SEND OUT): Labcorp test code: 81950

## 2022-05-22 LAB — PHOSPHORUS: Phosphorus: 2.1 mg/dL — ABNORMAL LOW (ref 2.5–4.6)

## 2022-05-22 LAB — OSMOLALITY: Osmolality: 285 mOsm/kg (ref 275–295)

## 2022-05-22 MED ORDER — INSULIN GLARGINE-YFGN 100 UNIT/ML ~~LOC~~ SOLN
10.0000 [IU] | Freq: Once | SUBCUTANEOUS | Status: AC
  Administered 2022-05-22: 10 [IU] via SUBCUTANEOUS
  Filled 2022-05-22: qty 0.1

## 2022-05-22 MED ORDER — VITAMIN D (ERGOCALCIFEROL) 1.25 MG (50000 UNIT) PO CAPS
50000.0000 [IU] | ORAL_CAPSULE | ORAL | Status: DC
  Administered 2022-05-22: 50000 [IU] via ORAL
  Filled 2022-05-22: qty 1

## 2022-05-22 MED ORDER — POTASSIUM CHLORIDE CRYS ER 20 MEQ PO TBCR
40.0000 meq | EXTENDED_RELEASE_TABLET | Freq: Once | ORAL | Status: AC
  Administered 2022-05-22: 40 meq via ORAL
  Filled 2022-05-22: qty 2

## 2022-05-22 MED ORDER — INSULIN STARTER KIT- PEN NEEDLES (SPANISH)
1.0000 | Freq: Once | Status: AC
  Administered 2022-05-22: 1
  Filled 2022-05-22: qty 1

## 2022-05-22 MED ORDER — INSULIN GLARGINE-YFGN 100 UNIT/ML ~~LOC~~ SOLN
20.0000 [IU] | Freq: Every day | SUBCUTANEOUS | Status: DC
  Administered 2022-05-22: 20 [IU] via SUBCUTANEOUS
  Filled 2022-05-22 (×2): qty 0.2

## 2022-05-22 MED ORDER — POTASSIUM CHLORIDE 10 MEQ/100ML IV SOLN
10.0000 meq | INTRAVENOUS | Status: AC
  Administered 2022-05-22 (×2): 10 meq via INTRAVENOUS
  Filled 2022-05-22 (×2): qty 100

## 2022-05-22 MED ORDER — POTASSIUM PHOSPHATES 15 MMOLE/5ML IV SOLN
30.0000 mmol | Freq: Once | INTRAVENOUS | Status: AC
  Administered 2022-05-22: 30 mmol via INTRAVENOUS
  Filled 2022-05-22: qty 10

## 2022-05-22 NOTE — Plan of Care (Signed)

## 2022-05-22 NOTE — TOC Initial Note (Signed)
Transition of Care [facility]) - Initial/Assessment Note    Patient Details  Name: Erika Maxwell MRN: [id number] Date of Birth: Sep 22, 1990  Transition of [facility]) CM/SW Contact:    Beverly Sessions, RN Phone Number: 05/22/2022, 3:31 PM  Clinical Narrative:                 Met with patient at bedside.  Utilized electronic spanish interpreter   Admitted NUU:[id number], hyperglycemia  Admitted from: home with husband and children  PCP: no PCP Current home health/prior home health/DME: NA  Patient is uninsured.  States she is currently employed Provided patient with application to [facility] .  Provided patient with charity glucometer kit. Notified MD that if patient discharges during the week [facility] outpatient pharmacy can be utilized, or if we know ahead of time what the patient would need for a weekend discharge.  If unable to utilize [facility] will need to use [facility] in order to obtain $25 insulin.  Patient states she will be able to afford medications at discharge.  TOC can provide goodrx coupons if there are other medications at discharge         Patient Goals and CMS Choice            Expected Discharge Plan and Services                                              Prior Living Arrangements/Services                       Activities of Daily Living Home Assistive Devices/Equipment: None ADL Screening (condition at time of admission) Patient's cognitive ability adequate to safely complete daily activities?: Yes Is the patient deaf or have difficulty hearing?: No Does the patient have difficulty seeing, even when wearing glasses/contacts?: No Does the patient have difficulty concentrating, remembering, or making decisions?: No Patient able to express need for assistance with ADLs?: Yes Does the patient have difficulty dressing or bathing?: No Independently performs ADLs?: Yes (appropriate for developmental age) Does the patient  have difficulty walking or climbing stairs?: No Weakness of Legs: None Weakness of Arms/Hands: None  Permission Sought/Granted                  Emotional Assessment              Admission diagnosis:  Sepsis ([place]) [A41.9] Sepsis due to cellulitis ([place]) [L03.90, A41.9] Patient Active Problem List   Diagnosis Date Noted   Sepsis ([place]) 05/20/2022   Cellulitis and abscess of trunk 05/20/2022   Hyponatremia 05/20/2022   Morbid obesity ([place]) 06/19/2016   History of diet controlled gestational diabetes mellitus (GDM) 06/18/2016   Irregular contractions 05/15/2016   PCP:  Pcp, No Pharmacy:   [facility] [place], [place] [address] [address] Phone: [phone] Fax: [phone]  [id number] - [place], [place] - [address] [place] [address] Phone: [phone] Fax: [phone]     Social Determinants of Health (SDOH) Social History: SDOH Screenings   Food Insecurity: No Food Insecurity (05/21/2022)  Housing: Low Risk  (05/21/2022)  Transportation Needs: No Transportation Needs (05/21/2022)  Utilities: Not At Risk (05/21/2022)  Depression (PHQ2-9): Low Risk  (10/04/2019)  Tobacco Use: Low Risk  (05/20/2022)  SDOH Interventions:     Readmission Risk Interventions     No data to display

## 2022-05-22 NOTE — Inpatient Diabetes Management (Signed)
Inpatient Diabetes Program Recommendations  AACE/ADA: New Consensus Statement on Inpatient Glycemic Control (2015)  Target Ranges:  Prepandial:   less than 140 mg/dL      Peak postprandial:   less than 180 mg/dL (1-2 hours)      Critically ill patients:  140 - 180 mg/dL   Lab Results  Component Value Date   GLUCAP 266 (H) 05/22/2022   HGBA1C 9.3 (H) 05/20/2022    Latest Reference Range & Units 05/21/22 08:53 05/21/22 11:47 05/21/22 16:02 05/21/22 21:02 05/22/22 07:58  Glucose-Capillary 70 - 99 mg/dL 234 (H) Novolog 11 units 207 (H) Novolog 7 units 276 (H) Novolog 11 units 245 (H) Novolog 2 units 266 (H) Novolog 11 units  (H): Data is abnormally high  Current orders for Inpatient glycemic control: Novolog 0-20 units correction tid, 0-5 units hs  Inpatient Diabetes Program Recommendations:   Patient has received total of Novolog 31 units correction over the past 24 hrs. With CBGs continue >200.  Please consider: -Semglee 20 units qd (0.2 units/kg x 141 kg = 28 units)   Spoke with pt @ bedside using Spanish [facility] services interpretor # [phone] about new diagnosis. Discussed A1C results 9.3 (average glucose 220 over the past 2-3 months) and explained what an A1C is, basic pathophysiology of DM Type 2, basic home care, basic diabetes diet nutrition principles, importance of checking CBGs and maintaining good CBG control to prevent long-term and short-term complications. Reviewed signs and symptoms of hyperglycemia and hypoglycemia and how to treat hypoglycemia at home. Also reviewed blood sugar goals at home.  RNs to provide ongoing basic DM education at bedside with this patient and allow pt. To administer own injections to prepare if discharged home on insulin.  Patient's mom has type 2 diabetes and is on insulin. Reviewed basic plate method nutrition with patient and discussed limiting sugary drinks and high carbohydrate foods. Patient agrees to decrease tortillas that she eats and other  high carbohydrate foods.  Patient and husband are signing up for the local gym and plans to take her mom as well. 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 Discussed importance of eating regular meals so not high amount of carbohydrates @ one meal.  To prepare if patient discharged home on insulin: Educated patient on insulin pen use at home. Reviewed contents of insulin flexpen starter kit. Reviewed all steps if insulin pen including attachment of needle, 2-unit air shot, dialing up dose, giving injection, removing needle, disposal of sharps, storage of unused insulin, disposal of insulin etc. Patient able to provide successful return demonstration. Also reviewed troubleshooting with insulin pen. MD to give patient Rxs for insulin pens and insulin pen needles if discharged on insulin.   Thank you, Nani Gasser. Ameri Cahoon, RN, MSN, CDE  Diabetes Coordinator Inpatient Glycemic Control Team Team Pager [phone] (8am-5pm) 05/22/2022 11:18 AM

## 2022-05-22 NOTE — Plan of Care (Signed)
  RD consulted for nutrition education regarding diabetes.   Lab Results  Component Value Date   HGBA1C 9.3 (H) 05/20/2022   Spoke with pt at bedside, who was pleasant and in good spirits today. Pt shares that she is feeling much better today. She recalls being visited by DM coordinator. She was able to teach back basic principles of DM self-management to this RD. SHe shares she intends to join a gym so she can exercise daily, drink water, and decrease simple carbohydrates such as tortillas in her diet.   RD provided "Carbohydrate Counting for People with Diabetes" handout from the Academy of [employer]. Discussed different food groups and their effects on blood sugar, emphasizing carbohydrate-containing foods. Provided list of carbohydrates and recommended serving sizes of common foods.  Discussed importance of controlled and consistent carbohydrate intake throughout the day. Provided examples of ways to balance meals/snacks and encouraged intake of high-fiber, whole grain complex carbohydrates. Teach back method used.  Expect fair to good compliance.  Body mass index is 58.73 kg/m. Pt meets criteria for obesity, class III based on current BMI. Obesity is a complex, chronic medical condition that is optimally managed by a multidisciplinary care team. Weight loss is not an ideal goal for an acute inpatient hospitalization. However, if further work-up for obesity is warranted, consider outpatient referral to [facility].    Current diet order is carb modified, patient is consuming approximately 100% of meals at this time. Labs and medications reviewed. No further nutrition interventions warranted at this time. RD contact information provided. If additional nutrition issues arise, please re-consult RD.  Loistine Chance, RD, LDN, [place] Registered Dietitian II Certified Diabetes Care and Education Specialist Please refer to [facility] for RD and/or RD  on-call/weekend/after hours pager

## 2022-05-22 NOTE — Progress Notes (Signed)
[facility] Progress Note  Patient: Erika Maxwell    VVO:[id number]  DOA: 05/20/2022     Date of Service: the patient was seen and examined on 05/22/2022  Chief Complaint  Patient presents with   Abscess   Brief hospital course:  Erika Maxwell is a 32 y.o. female with medical history significant of history of gallstones, morbid obesity, gestational diabetes who presents to the ER with lower abdominal wall redness, swelling, fever and severe pain.  Patient appears to have significant panniculitis.  Patient also meets sepsis criteria with leukocytosis and heart rate.  She is being admitted with sepsis due to panniculitis.    Assessment and Plan:  # Sepsis due to cellulitis:  Fever, tach, leukocytosis, tachypnea, elevated lactic acid, cellulitis Continue vancomycin, cefepime and Flagyl for now, we will de-escalate antibiotics after some improvement Pharmacy consulted for dosing and trough monitoring Continue Tylenol and oxycodone as needed for pain control Started Toradol 15 mg every 8 hourly for 5 days to decrease inflammation Blood cultures NGTD   # Hypophosphatemia due to nutritional deficiency, Phos repleted. Monitor and replete as needed.   # Hypokalemia, potassium repleted. Monitor and replete as needed.    # Diabetes mellitus type 2, and had gestational diabetes mellitus and then she denied being diabetic.  HbA1c 9.3 1/25 Lantus 10 units given in the a.m., started Lantus 20 units nightly Started NovoLog sliding scale, monitor CBG Continue diabetic diet Diabetic coordinator help appreciated. Patient will need diabetic medications and insulin at discharge.    # morbid obesity: BMI 59, calorie restricted diet and daily exercise advised to lose body weight    # hyponatremia: May be pseudohyponatremia from hyperglycemia.  Continue to monitor sodium level.  Serum osmolality 285, within normal range   # Hypokalemia, potassium repleted.  Monitor  and replete as needed # Hypophosphatemia, Phos repleted.  Monitor and replete as needed.  Vitamin D deficiency: started vitamin D 50,000 units p.o. weekly, follow with PCP to repeat vitamin D level after 3 to 6 months.  Body mass index is 58.73 kg/m.  Interventions:       Diet: Carb modified diet DVT Prophylaxis: Subcutaneous Lovenox   Advance goals of care discussion: Full code  Family Communication: family was not present at bedside, at the time of interview.  The pt provided permission to discuss medical plan with the family. Opportunity was given to ask question and all questions were answered satisfactorily.   Disposition:  Pt is from Home, admitted with sepsis due to cellulitis, still on IV Abx , which precludes a safe discharge. Discharge to Home, when stable, may need IV antibiotics for 4 to 5 days.  Subjective: No significant overnight events, patient feels significant improvement in the pain, pain is 1-2/10 at rest but she mentioned that it gets worse with movement and walking. Denies any chest pain or palpitation, no shortness of breath.   Physical Exam: General: NAD, lying comfortably Appear in no distress, affect appropriate Eyes: PERRLA ENT: Oral Mucosa Clear, moist  Neck: no JVD,  Cardiovascular: S1 and S2 Present, no Murmur,  Respiratory: good respiratory effort, Bilateral Air entry equal and Decreased, no Crackles, no wheezes Abdomen: Bowel Sound present, Soft, lower abdominal wall tenderness and erythema, slight improvement  Skin: no rashes Extremities: no Pedal edema, no calf tenderness Neurologic: without any new focal findings Gait not checked due to patient safety concerns  Vitals:   05/21/22 2129 05/21/22 2333 05/22/22 0515 05/22/22 0756  BP: 110/67 116/73 Marland Kitchen)  104/52 113/75  Pulse: (!) 103 (!) 102 100 (!) 103  Resp: 18 20 18 16   Temp: 100.3 F (37.9 C) 98.9 F (37.2 C) 98.7 F (37.1 C) 98.5 F (36.9 C)  TempSrc: Oral Oral Oral Oral  SpO2: 97%  97% 96% 94%  Weight:      Height:        Intake/Output Summary (Last 24 hours) at 05/22/2022 1446 Last data filed at 05/22/2022 1125 Gross per 24 hour  Intake 3462.5 ml  Output --  Net 3462.5 ml   Filed Weights   05/20/22 1711  Weight: (!) 141 kg    Data Reviewed: I have personally reviewed and interpreted daily labs, tele strips, imagings as discussed above. I reviewed all nursing notes, pharmacy notes, vitals, pertinent old records I have discussed plan of care as described above with RN and patient/family.  CBC: Recent Labs  Lab 05/20/22 1715 05/21/22 0301 05/22/22 0553  WBC 17.8* 14.8* 10.9*  HGB 14.0 11.6* 10.3*  HCT 41.7 34.3* 30.2*  MCV 84.2 84.7 83.2  PLT 227 200 580   Basic Metabolic Panel: Recent Labs  Lab 05/20/22 1715 05/21/22 0301 05/22/22 0553  NA 128* 132* 132*  K 3.7 3.2* 3.2*  CL 93* 99 104  CO2 23 23 21*  GLUCOSE 385* 289* 267*  BUN 12 11 9   CREATININE 0.74 0.53 0.52  CALCIUM 8.5* 7.7* 7.6*  MG  --  2.0 2.1  PHOS  --  1.8* 2.1*    Studies: No results found.  Scheduled Meds:  enoxaparin (LOVENOX) injection  0.5 mg/kg Subcutaneous Q24H   insulin aspart  0-20 Units Subcutaneous TID WC   insulin aspart  0-5 Units Subcutaneous QHS   insulin glargine-yfgn  10 Units Subcutaneous Once   insulin glargine-yfgn  20 Units Subcutaneous QHS   ketorolac  15 mg Intravenous Q8H   potassium chloride  40 mEq Oral Once   Vitamin D (Ergocalciferol)  50,000 Units Oral Q7 days   Continuous Infusions:  ceFEPime (MAXIPIME) IV     ceFEPime (MAXIPIME) IV Stopped (05/22/22 0604)   lactated ringers 75 mL/hr at 05/22/22 0422   metronidazole 500 mg (05/22/22 1328)   potassium PHOSPHATE IVPB (in mmol)     vancomycin     vancomycin 1,250 mg (05/22/22 0944)   PRN Meds: acetaminophen **OR** acetaminophen, morphine injection, ondansetron **OR** ondansetron (ZOFRAN) IV, oxyCODONE  Time spent: 50 minutes  Author: Val Riles. MD [facility] 05/22/2022  2:46 PM  To reach On-call, see care teams to locate the attending and reach out to them via [email or web address]. If 7PM-7AM, please contact night-coverage If you still have difficulty reaching the attending provider, please page the [facility] (Director on Call) for [facility] on amion for assistance.

## 2022-05-23 LAB — BASIC METABOLIC PANEL
Anion gap: 8 (ref 5–15)
BUN: 7 mg/dL (ref 6–20)
CO2: 20 mmol/L — ABNORMAL LOW (ref 22–32)
Calcium: 7.6 mg/dL — ABNORMAL LOW (ref 8.9–10.3)
Chloride: 104 mmol/L (ref 98–111)
Creatinine, Ser: 0.45 mg/dL (ref 0.44–1.00)
GFR, Estimated: >60 mL/min (ref >60)
Glucose, Bld: 224 mg/dL — ABNORMAL HIGH (ref 70–99)
Potassium: 3.5 mmol/L (ref 3.5–5.1)
Sodium: 132 mmol/L — ABNORMAL LOW (ref 135–145)

## 2022-05-23 LAB — CBC
HCT: 30 % — ABNORMAL LOW (ref 36.0–46.0)
Hemoglobin: 10.2 g/dL — ABNORMAL LOW (ref 12.0–15.0)
MCH: 28.3 pg (ref 26.0–34.0)
MCHC: 34 g/dL (ref 30.0–36.0)
MCV: 83.1 fL (ref 80.0–100.0)
Platelets: 232 10*3/uL (ref 150–400)
RBC: 3.61 MIL/uL — ABNORMAL LOW (ref 3.87–5.11)
RDW: 12.9 % (ref 11.5–15.5)
WBC: 7.8 10*3/uL (ref 4.0–10.5)
nRBC: 0 % (ref 0.0–0.2)

## 2022-05-23 LAB — TSH: TSH: 2.561 u[IU]/mL (ref 0.350–4.500)

## 2022-05-23 LAB — GLUCOSE, CAPILLARY
Glucose-Capillary: 195 mg/dL — ABNORMAL HIGH (ref 70–99)
Glucose-Capillary: 201 mg/dL — ABNORMAL HIGH (ref 70–99)
Glucose-Capillary: 175 mg/dL — ABNORMAL HIGH (ref 70–99)
Glucose-Capillary: 161 mg/dL — ABNORMAL HIGH (ref 70–99)

## 2022-05-23 LAB — PHOSPHORUS: Phosphorus: 2.2 mg/dL — ABNORMAL LOW (ref 2.5–4.6)

## 2022-05-23 LAB — MAGNESIUM: Magnesium: 1.9 mg/dL (ref 1.7–2.4)

## 2022-05-23 MED ORDER — INSULIN GLARGINE-YFGN 100 UNIT/ML ~~LOC~~ SOLN
25.0000 [IU] | Freq: Every day | SUBCUTANEOUS | Status: DC
  Administered 2022-05-23 – 2022-05-24 (×2): 25 [IU] via SUBCUTANEOUS
  Filled 2022-05-23 (×3): qty 0.25

## 2022-05-23 MED ORDER — K PHOS MONO-SOD PHOS DI & MONO 155-852-130 MG PO TABS
500.0000 mg | ORAL_TABLET | Freq: Three times a day (TID) | ORAL | Status: AC
  Administered 2022-05-23 (×3): 500 mg via ORAL
  Filled 2022-05-23 (×3): qty 2

## 2022-05-23 NOTE — Inpatient Diabetes Management (Signed)
Inpatient Diabetes Program Recommendations  AACE/ADA: New Consensus Statement on Inpatient Glycemic Control (2015)  Target Ranges:  Prepandial:   less than 140 mg/dL      Peak postprandial:   less than 180 mg/dL (1-2 hours)      Critically ill patients:  140 - 180 mg/dL   Lab Results  Component Value Date   GLUCAP 195 (H) 05/23/2022   HGBA1C 9.3 (H) 05/20/2022    Latest Reference Range & Units 05/21/22 08:53 05/21/22 11:47 05/21/22 16:02 05/21/22 21:02 05/22/22 07:58 05/22/22 18:26 05/22/22 21:10 05/23/22 08:56  Glucose-Capillary 70 - 99 mg/dL 234 (H) 207 (H) 276 (H) 245 (H) 266 (H) 257 (H) 263 (H) 195 (H)  (H): Data is abnormally high  Diabetes history: hx GD  Outpatient Diabetes medications: None Current orders for Inpatient glycemic control: Semglee 20 units qd, Novolog 0-20 units tid  Inpatient Diabetes Program Recommendations:   Patient received total of 30 units yesterday. Please consider: -Increase Semglee to 25 units qd -Consider Metformin for discharge  Thank you, Nani Gasser. Maureen Duesing, RN, MSN, CDE  Diabetes Coordinator Inpatient Glycemic Control Team Team Pager [phone] (8am-5pm) 05/23/2022 11:28 AM

## 2022-05-23 NOTE — Progress Notes (Signed)
Rachael Fee NP notified of pt's pain and not due pain meds.

## 2022-05-23 NOTE — Progress Notes (Signed)
Pharmacy Antibiotic Note  Erika Maxwell is a 32 y.o. female admitted on 05/20/2022 with cellulitis/ panniculitis. Pharmacy has been consulted for Cefepime & Vancomycin dosing for 7 days.  -also on Metronidazole  Plan: -Cefepime 2 gm q8hr per Crcl 134 ml/min  -Pt given Vancomycin 2500 mg once. Vancomycin 1250 mg IV Q 12 hrs. Goal AUC 400-550. Expected AUC: 491.7 SCr used: 0.74, Vd used: 0.5, BMI: 58.6    Pharmacy will continue to follow and will adjust abx dosing as warranted.  Temp (24hrs), Avg:99 F (37.2 C), Min:97.9 F (36.6 C), Max:100.3 F (37.9 C)   Recent Labs  Lab 05/20/22 1715 05/20/22 1812 05/20/22 2151 05/21/22 0301 05/22/22 0553 05/23/22 0427  WBC 17.8*  --   --  14.8* 10.9* 7.8  CREATININE 0.74  --   --  0.53 0.52 0.45  LATICACIDVEN  --  2.2* 1.6  --   --   --      Estimated Creatinine Clearance: 134.4 mL/min (by C-G formula based on SCr of 0.45 mg/dL).    No Known Allergies  Antimicrobials this admission: 1/23 Cefepime >>   x 7 days 1/23 Vancomycin >>   x 7 days 1/23 Flagyl >>   x 7 days  Microbiology results: 1/23 BCx: [id number]  Thank you for allowing pharmacy to be a part of this patient's care.  Chinita Greenland PharmD Clinical Pharmacist 05/23/2022

## 2022-05-23 NOTE — Progress Notes (Signed)
[facility] Progress Note  Patient: Erika Maxwell    [id number]:[id number]  DOA: 05/20/2022     Date of Service: the patient was seen and examined on 05/23/2022  Chief Complaint  Patient presents with   Abscess   Brief hospital course:  Erika Maxwell is a 32 y.o. female with medical history significant of history of gallstones, morbid obesity, gestational diabetes who presents to the ER with lower abdominal wall redness, swelling, fever and severe pain.  Patient appears to have significant panniculitis.  Patient also meets sepsis criteria with leukocytosis and heart rate.  She is being admitted with sepsis due to panniculitis.    Assessment and Plan:  # Sepsis due to cellulitis:  Fever, tach, leukocytosis, tachypnea, elevated lactic acid, cellulitis Continue vancomycin, cefepime and Flagyl for now, we will de-escalate antibiotics after some improvement Pharmacy consulted for dosing and trough monitoring Continue Tylenol and oxycodone as needed for pain control Started Toradol 15 mg every 8 hourly for 5 days due to inflammation Blood cultures NGTD Picture saved in the media section   # Hypophosphatemia due to nutritional deficiency, Phos repleted. Monitor and replete as needed.  # Hypokalemia, potassium repleted. Monitor and replete as needed.    # Diabetes mellitus type 2, and had gestational diabetes mellitus and then she denied being diabetic.  HbA1c 9.3 1/25 Lantus 10 units given in the a.m., started Lantus 20 units nightly Started NovoLog sliding scale, monitor CBG Continue diabetic diet Diabetic coordinator help appreciated. Patient will need diabetic medications and insulin at discharge.    # morbid obesity: BMI 59, calorie restricted diet and daily exercise advised to lose body weight    # hyponatremia: May be pseudohyponatremia from hyperglycemia.  Continue to monitor sodium level.  Serum osmolality 285, within normal range   Vitamin D  deficiency: started vitamin D 50,000 units p.o. weekly, follow with PCP to repeat vitamin D level after 3 to 6 months.  Body mass index is 58.73 kg/m.  Interventions:       Diet: Carb modified diet DVT Prophylaxis: Subcutaneous Lovenox   Advance goals of care discussion: Full code  Family Communication: family was not present at bedside, at the time of interview.  The pt provided permission to discuss medical plan with the family. Opportunity was given to ask question and all questions were answered satisfactorily.   Disposition:  Pt is from Home, admitted with sepsis due to cellulitis, still on IV Abx , which precludes a safe discharge. Discharge to Home, when stable, may need IV antibiotics for 2-3 more days.  Subjective: No significant overnight events, patient feels improvement in the pain, chest pain at rest but more pain while moving around and on ambulation.  On palpation patient does not feel much improvement in the tenderness and swelling. Denies any chest pain or palpitation, no shortness of breath, no any other active issues.    Physical Exam: General: NAD, lying comfortably Appear in no distress, affect appropriate Eyes: PERRLA ENT: Oral Mucosa Clear, moist  Neck: no JVD,  Cardiovascular: S1 and S2 Present, no Murmur,  Respiratory: good respiratory effort, Bilateral Air entry equal and Decreased, no Crackles, no wheezes Abdomen: Bowel Sound present, Soft, lower abdominal wall tenderness and erythema, slight improvement  Skin: no rashes Extremities: no Pedal edema, no calf tenderness Neurologic: without any new focal findings Gait not checked due to patient safety concerns  Vitals:   05/22/22 2243 05/23/22 0402 05/23/22 0854 05/23/22 1534  BP: 118/68 126/66 114/74  96/63  Pulse: (!) 104 (!) 107 94 93  Resp: 18 20 18 18   Temp: 97.9 F (36.6 C) 99.5 F (37.5 C) 98.2 F (36.8 C) 98.7 F (37.1 C)  TempSrc: Oral Oral Oral Oral  SpO2: (!) 84% 97% 98% 100%   Weight:      Height:        Intake/Output Summary (Last 24 hours) at 05/23/2022 1616 Last data filed at 05/23/2022 1512 Gross per 24 hour  Intake 2646.4 ml  Output 0 ml  Net 2646.4 ml   Filed Weights   05/20/22 1711  Weight: (!) 141 kg    Data Reviewed: I have personally reviewed and interpreted daily labs, tele strips, imagings as discussed above. I reviewed all nursing notes, pharmacy notes, vitals, pertinent old records I have discussed plan of care as described above with RN and patient/family.  CBC: Recent Labs  Lab 05/20/22 1715 05/21/22 0301 05/22/22 0553 05/23/22 0427  WBC 17.8* 14.8* 10.9* 7.8  HGB 14.0 11.6* 10.3* 10.2*  HCT 41.7 34.3* 30.2* 30.0*  MCV 84.2 84.7 83.2 83.1  PLT 227 200 226 175   Basic Metabolic Panel: Recent Labs  Lab 05/20/22 1715 05/21/22 0301 05/22/22 0553 05/23/22 0427  NA 128* 132* 132* 132*  K 3.7 3.2* 3.2* 3.5  CL 93* 99 104 104  CO2 23 23 21* 20*  GLUCOSE 385* 289* 267* 224*  BUN 12 11 9 7   CREATININE 0.74 0.53 0.52 0.45  CALCIUM 8.5* 7.7* 7.6* 7.6*  MG  --  2.0 2.1 1.9  PHOS  --  1.8* 2.1* 2.2*    Studies: No results found.  Scheduled Meds:  enoxaparin (LOVENOX) injection  0.5 mg/kg Subcutaneous Q24H   insulin aspart  0-20 Units Subcutaneous TID WC   insulin aspart  0-5 Units Subcutaneous QHS   insulin glargine-yfgn  25 Units Subcutaneous QHS   ketorolac  15 mg Intravenous Q8H   phosphorus  500 mg Oral TID   Vitamin D (Ergocalciferol)  50,000 Units Oral Q7 days   Continuous Infusions:  ceFEPime (MAXIPIME) IV 2 g (05/23/22 1401)   lactated ringers Stopped (05/23/22 1407)   metronidazole Stopped (05/23/22 1153)   vancomycin 1,250 mg (05/23/22 1155)   PRN Meds: acetaminophen **OR** acetaminophen, morphine injection, ondansetron **OR** ondansetron (ZOFRAN) IV, oxyCODONE  Time spent: 50 minutes  Author: Val Riles. MD [facility] 05/23/2022 4:16 PM  To reach On-call, see care teams to locate the  attending and reach out to them via [email or web address]. If 7PM-7AM, please contact night-coverage If you still have difficulty reaching the attending provider, please page the [facility] (Director on Call) for [facility] on amion for assistance.

## 2022-05-24 DIAGNOSIS — M793 Panniculitis, unspecified: Secondary | ICD-10-CM | POA: Insufficient documentation

## 2022-05-24 DIAGNOSIS — L03319 Cellulitis of trunk, unspecified: Secondary | ICD-10-CM

## 2022-05-24 DIAGNOSIS — E876 Hypokalemia: Secondary | ICD-10-CM | POA: Insufficient documentation

## 2022-05-24 DIAGNOSIS — Z6841 Body Mass Index (BMI) 40.0 and over, adult: Secondary | ICD-10-CM

## 2022-05-24 DIAGNOSIS — L02219 Cutaneous abscess of trunk, unspecified: Secondary | ICD-10-CM

## 2022-05-24 LAB — CBC
HCT: 32.9 % — ABNORMAL LOW (ref 36.0–46.0)
Hemoglobin: 10.8 g/dL — ABNORMAL LOW (ref 12.0–15.0)
MCH: 28.1 pg (ref 26.0–34.0)
MCHC: 32.8 g/dL (ref 30.0–36.0)
MCV: 85.5 fL (ref 80.0–100.0)
Platelets: 282 10*3/uL (ref 150–400)
RBC: 3.85 MIL/uL — ABNORMAL LOW (ref 3.87–5.11)
RDW: 13.1 % (ref 11.5–15.5)
WBC: 7.5 10*3/uL (ref 4.0–10.5)
nRBC: 0.7 % — ABNORMAL HIGH (ref 0.0–0.2)

## 2022-05-24 LAB — GLUCOSE, CAPILLARY
Glucose-Capillary: 173 mg/dL — ABNORMAL HIGH (ref 70–99)
Glucose-Capillary: 165 mg/dL — ABNORMAL HIGH (ref 70–99)
Glucose-Capillary: 173 mg/dL — ABNORMAL HIGH (ref 70–99)
Glucose-Capillary: 210 mg/dL — ABNORMAL HIGH (ref 70–99)

## 2022-05-24 LAB — BASIC METABOLIC PANEL
Anion gap: 10 (ref 5–15)
BUN: 8 mg/dL (ref 6–20)
CO2: 22 mmol/L (ref 22–32)
Calcium: 7.9 mg/dL — ABNORMAL LOW (ref 8.9–10.3)
Chloride: 104 mmol/L (ref 98–111)
Creatinine, Ser: 0.49 mg/dL (ref 0.44–1.00)
GFR, Estimated: >60 mL/min (ref >60)
Glucose, Bld: 161 mg/dL — ABNORMAL HIGH (ref 70–99)
Potassium: 3.3 mmol/L — ABNORMAL LOW (ref 3.5–5.1)
Sodium: 136 mmol/L (ref 135–145)

## 2022-05-24 LAB — PHOSPHORUS: Phosphorus: 3.5 mg/dL (ref 2.5–4.6)

## 2022-05-24 LAB — VANCOMYCIN, RANDOM: Vancomycin Rm: 16 ug/mL

## 2022-05-24 LAB — MAGNESIUM: Magnesium: 1.9 mg/dL (ref 1.7–2.4)

## 2022-05-24 MED ORDER — FLUCONAZOLE IN SODIUM CHLORIDE 200-0.9 MG/100ML-% IV SOLN
200.0000 mg | Freq: Once | INTRAVENOUS | Status: AC
  Administered 2022-05-24: 200 mg via INTRAVENOUS
  Filled 2022-05-24: qty 100

## 2022-05-24 MED ORDER — FLUCONAZOLE 100 MG PO TABS
200.0000 mg | ORAL_TABLET | Freq: Every day | ORAL | Status: DC
  Administered 2022-05-25: 200 mg via ORAL
  Filled 2022-05-24: qty 1
  Filled 2022-05-24: qty 2

## 2022-05-24 MED ORDER — POTASSIUM CHLORIDE CRYS ER 20 MEQ PO TBCR
40.0000 meq | EXTENDED_RELEASE_TABLET | ORAL | Status: AC
  Administered 2022-05-24 (×2): 40 meq via ORAL
  Filled 2022-05-24 (×2): qty 2

## 2022-05-24 NOTE — Progress Notes (Signed)
  Progress Note   Patient: Erika Maxwell [id number]:[id number] DOB: January 07, 1991 DOA: 05/20/2022     4 DOS: the patient was seen and examined on 05/24/2022   Brief hospital course: Jourdyn Hasler is a 32 y.o. female with medical history significant of history of gallstones, morbid obesity, gestational diabetes who presents to the ER with lower abdominal wall redness, swelling, fever and severe pain.  Patient appears to have significant panniculitis.  Patient also meets sepsis criteria with leukocytosis and heart rate.  She is being admitted with sepsis due to panniculitis  Patient was treated with vancomycin, cefepime and Flagyl. 1/27.  Condition not improving, significant fungal component, discontinue vancomycin and Flagyl, added Diflucan.  Assessment and Plan: # Sepsis due to cellulitis:  Fever, tach, leukocytosis, tachypnea, elevated lactic acid, cellulitis Patient has been treated with vancomycin, Flagyl and cefepime for 3 days.  Sepsis has resolved, however, cellulitis does not seem to be improving. Patient cellulitis involving the lower abdomen, also skin fold.  A significant component of fungal infection, I will discontinue vancomycin and Flagyl, continue vancomycin for another day.  Give a dose of Diflucan IV 2 mg x 1, followed by 200 mg oral daily for 3 days.     # Hypophosphatemia   # Hypokalemia Hyponatremia. Conditions are improved, discontinue IV fluids.  Give another dose of 80 mEq of oral KCl for potassium 3.3.   # Diabetes mellitus type 2,    HbA1c 9.3 On Lantus, glucose relatively controlled.     # morbid obesity: BMI 59, calorie restricted diet and daily exercise advised to lose body weight        Subjective:  Patient still complains significant lower abdominal burning pain.  Still had a fever of 101.3 yesterday evening.  Physical Exam: Vitals:   05/23/22 1915 05/23/22 2200 05/24/22 0427 05/24/22 0818  BP: 138/85  130/89 117/84  Pulse: (!) 101   99 85  Resp: 20  20 18   Temp: (!) 101.3 F (38.5 C) 99 F (37.2 C) 99.9 F (37.7 C) 99.3 F (37.4 C)  TempSrc: Oral  Oral Oral  SpO2: 98%  98% 97%  Weight:      Height:       General exam: Appears calm and comfortable, morbidly obese. Respiratory system: Clear to auscultation. Respiratory effort normal. Cardiovascular system: S1 & S2 heard, RRR. No JVD, murmurs, rubs, gallops or clicks. No pedal edema. Gastrointestinal system: Abdomen is nondistended, soft and nontender. No organomegaly or masses felt. Normal bowel sounds heard. Central nervous system: Alert and oriented. No focal neurological deficits. Extremities: no edema Skin: : Lower abdomen still significant redness, 2 x ruptured pustules. Psychiatry: Judgement and insight appear normal. Mood & affect appropriate.   Data Reviewed:  Lab results reviewed.  Blood culture negative, MRSA culture negative.  Family Communication: Mother and sister at bedside, updated  Disposition: Status is: Inpatient Remains inpatient appropriate because: Severity of disease, IV treatment.  Planned Discharge Destination: Home    Time spent: 35 minutes  Author: Sharen Hones, MD 05/24/2022 1:50 PM  For on call review [email or web address].

## 2022-05-24 NOTE — Hospital Course (Signed)
Erika Maxwell Hennie Duos is a 32 y.o. female with medical history significant of history of gallstones, morbid obesity, gestational diabetes who presents to the ER with lower abdominal wall redness, swelling, fever and severe pain.  Patient appears to have significant panniculitis.  Patient also meets sepsis criteria with leukocytosis and heart rate.  She is being admitted with sepsis due to panniculitis  Patient was treated with vancomycin, cefepime and Flagyl. 1/27.  Condition not improving, significant fungal component, discontinue vancomycin and Flagyl, added Diflucan. 1/28.  Cellulitis much improved after giving 1 dose of IV Diflucan.  Medically stable to be discharged today.

## 2022-05-24 NOTE — Plan of Care (Signed)
  Problem: Coping: Goal: Ability to adjust to condition or change in health will improve Outcome: Progressing   Problem: Health Behavior/Discharge Planning: Goal: Ability to identify and utilize available resources and services will improve Outcome: Progressing Goal: Ability to manage health-related needs will improve Outcome: Progressing   Problem: Nutritional: Goal: Maintenance of adequate nutrition will improve Outcome: Progressing   Problem: Skin Integrity: Goal: Risk for impaired skin integrity will decrease Outcome: Progressing   Problem: Clinical Measurements: Goal: Diagnostic test results will improve Outcome: Progressing Goal: Signs and symptoms of infection will decrease Outcome: Progressing   Problem: Education: Goal: Knowledge of General Education information will improve Description: Including pain rating scale, medication(s)/side effects and non-pharmacologic comfort measures Outcome: Progressing   Problem: Clinical Measurements: Goal: Ability to maintain clinical measurements within normal limits will improve Outcome: Progressing Goal: Diagnostic test results will improve Outcome: Progressing Goal: Cardiovascular complication will be avoided Outcome: Progressing   Problem: Activity: Goal: Risk for activity intolerance will decrease Outcome: Progressing   Problem: Coping: Goal: Level of anxiety will decrease Outcome: Progressing   Problem: Pain Managment: Goal: General experience of comfort will improve Outcome: Progressing   Problem: Safety: Goal: Ability to remain free from injury will improve Outcome: Progressing   Problem: Skin Integrity: Goal: Risk for impaired skin integrity will decrease Outcome: Progressing

## 2022-05-25 ENCOUNTER — Other Ambulatory Visit: Payer: Self-pay

## 2022-05-25 LAB — BASIC METABOLIC PANEL
Anion gap: 8 (ref 5–15)
BUN: 6 mg/dL (ref 6–20)
CO2: 24 mmol/L (ref 22–32)
Calcium: 8.2 mg/dL — ABNORMAL LOW (ref 8.9–10.3)
Chloride: 102 mmol/L (ref 98–111)
Creatinine, Ser: 0.42 mg/dL — ABNORMAL LOW (ref 0.44–1.00)
GFR, Estimated: >60 mL/min (ref >60)
Glucose, Bld: 188 mg/dL — ABNORMAL HIGH (ref 70–99)
Potassium: 3.2 mmol/L — ABNORMAL LOW (ref 3.5–5.1)
Sodium: 134 mmol/L — ABNORMAL LOW (ref 135–145)

## 2022-05-25 LAB — CBC
HCT: 32.3 % — ABNORMAL LOW (ref 36.0–46.0)
Hemoglobin: 10.7 g/dL — ABNORMAL LOW (ref 12.0–15.0)
MCH: 27.9 pg (ref 26.0–34.0)
MCHC: 33.1 g/dL (ref 30.0–36.0)
MCV: 84.3 fL (ref 80.0–100.0)
Platelets: 297 10*3/uL (ref 150–400)
RBC: 3.83 MIL/uL — ABNORMAL LOW (ref 3.87–5.11)
RDW: 13 % (ref 11.5–15.5)
WBC: 7.5 10*3/uL (ref 4.0–10.5)
nRBC: 1.1 % — ABNORMAL HIGH (ref 0.0–0.2)

## 2022-05-25 LAB — CULTURE, BLOOD (SINGLE): Culture: NO GROWTH

## 2022-05-25 LAB — GLUCOSE, CAPILLARY
Glucose-Capillary: 185 mg/dL — ABNORMAL HIGH (ref 70–99)
Glucose-Capillary: 218 mg/dL — ABNORMAL HIGH (ref 70–99)

## 2022-05-25 MED ORDER — BLOOD GLUCOSE TEST VI STRP: 1.0000 | ORAL_STRIP | Freq: Three times a day (TID) | 0 refills | Status: DC

## 2022-05-25 MED ORDER — POTASSIUM CHLORIDE CRYS ER 20 MEQ PO TBCR
40.0000 meq | EXTENDED_RELEASE_TABLET | ORAL | Status: AC
  Administered 2022-05-25 (×2): 40 meq via ORAL
  Filled 2022-05-25 (×2): qty 2

## 2022-05-25 MED ORDER — BLOOD GLUCOSE TEST VI STRP
1.0000 | ORAL_STRIP | Freq: Three times a day (TID) | 0 refills | Status: AC
  Filled 2022-05-25: qty 100, 30d supply, fill #0

## 2022-05-25 MED ORDER — SODIUM CHLORIDE 0.9 % IV SOLN
1.0000 g | Freq: Once | INTRAVENOUS | Status: AC
  Administered 2022-05-25: 1 g via INTRAVENOUS
  Filled 2022-05-25: qty 1

## 2022-05-25 MED ORDER — CEPHALEXIN 500 MG PO CAPS
500.0000 mg | ORAL_CAPSULE | Freq: Four times a day (QID) | ORAL | 0 refills | Status: AC
  Filled 2022-05-25: qty 12, 3d supply, fill #0

## 2022-05-25 MED ORDER — BLOOD GLUCOSE MONITORING SUPPL DEVI: 1.0000 | Freq: Three times a day (TID) | 0 refills | Status: DC

## 2022-05-25 MED ORDER — POTASSIUM CHLORIDE CRYS ER 10 MEQ PO TBCR
20.0000 meq | EXTENDED_RELEASE_TABLET | Freq: Two times a day (BID) | ORAL | 0 refills | Status: AC
  Filled 2022-05-25: qty 12, 3d supply, fill #0

## 2022-05-25 MED ORDER — POTASSIUM CHLORIDE 10 MEQ/100ML IV SOLN
10.0000 meq | Freq: Once | INTRAVENOUS | Status: AC
  Administered 2022-05-25: 10 meq via INTRAVENOUS
  Filled 2022-05-25: qty 100

## 2022-05-25 MED ORDER — FLUCONAZOLE 200 MG PO TABS
200.0000 mg | ORAL_TABLET | Freq: Every day | ORAL | 0 refills | Status: AC
  Filled 2022-05-25: qty 3, 3d supply, fill #0

## 2022-05-25 MED ORDER — GLIPIZIDE 5 MG PO TABS: 5.0000 mg | ORAL_TABLET | Freq: Every day | ORAL | 0 refills | Status: DC

## 2022-05-25 MED ORDER — GLIPIZIDE 5 MG PO TABS
5.0000 mg | ORAL_TABLET | Freq: Every day | ORAL | 0 refills | Status: AC
  Filled 2022-05-25: qty 30, 30d supply, fill #0

## 2022-05-25 MED ORDER — LANCETS MISC. MISC
1.0000 | Freq: Three times a day (TID) | 0 refills | Status: AC
  Filled 2022-05-25: qty 100, 30d supply, fill #0

## 2022-05-25 MED ORDER — METFORMIN HCL 500 MG PO TABS: 500.0000 mg | ORAL_TABLET | Freq: Two times a day (BID) | ORAL | 0 refills | Status: DC

## 2022-05-25 MED ORDER — LANCET DEVICE MISC
1.0000 | Freq: Three times a day (TID) | 0 refills | Status: AC
  Filled 2022-05-25: qty 1, 30d supply, fill #0

## 2022-05-25 MED ORDER — CEPHALEXIN 500 MG PO CAPS: 500.0000 mg | ORAL_CAPSULE | Freq: Four times a day (QID) | ORAL | 0 refills | Status: DC

## 2022-05-25 MED ORDER — FLUCONAZOLE 200 MG PO TABS: 200.0000 mg | ORAL_TABLET | Freq: Every day | ORAL | 0 refills | Status: DC

## 2022-05-25 MED ORDER — BLOOD GLUCOSE MONITORING SUPPL DEVI
1.0000 | Freq: Three times a day (TID) | 0 refills | Status: AC
  Filled 2022-05-25: qty 1, fill #0

## 2022-05-25 MED ORDER — POTASSIUM CHLORIDE CRYS ER 10 MEQ PO TBCR: 20.0000 meq | EXTENDED_RELEASE_TABLET | Freq: Two times a day (BID) | ORAL | 0 refills | Status: DC

## 2022-05-25 MED ORDER — LANCETS MISC. MISC: 1.0000 | Freq: Three times a day (TID) | 0 refills | Status: DC

## 2022-05-25 MED ORDER — METFORMIN HCL 500 MG PO TABS
500.0000 mg | ORAL_TABLET | Freq: Two times a day (BID) | ORAL | 0 refills | Status: AC
  Filled 2022-05-25: qty 60, 30d supply, fill #0

## 2022-05-25 MED ORDER — LANCET DEVICE MISC: 1.0000 | Freq: Three times a day (TID) | 0 refills | Status: DC

## 2022-05-25 NOTE — Discharge Summary (Signed)
Physician Discharge Summary   Patient: Erika Maxwell MRN: [id number] DOB: November 17, 1990  Admit date:     05/20/2022  Discharge date: 05/25/22  Discharge Physician: Marrion Coy   PCP: Pcp, No   Recommendations at discharge:    [facility].  Check BMP at next office visit. Adjust medication for diabetes at the next office visit.  Discharge Diagnoses: Principal Problem:   Sepsis (HCC) Active Problems:   History of diet controlled gestational diabetes mellitus (GDM)   Morbid obesity with BMI of 50.0-59.9, adult (HCC)   Cellulitis and abscess of trunk   Hyponatremia   Hypokalemia   Panniculitis   Hypophosphatemia  Resolved Problems:   * No resolved hospital problems. *  Hospital Course: Erika Maxwell is a 32 y.o. female with medical history significant of history of gallstones, morbid obesity, gestational diabetes who presents to the ER with lower abdominal wall redness, swelling, fever and severe pain.  Patient appears to have significant panniculitis.  Patient also meets sepsis criteria with leukocytosis and heart rate.  She is being admitted with sepsis due to panniculitis  Patient was treated with vancomycin, cefepime and Flagyl. 1/27.  Condition not improving, significant fungal component, discontinue vancomycin and Flagyl, added Diflucan. 1/28.  Cellulitis much improved after giving 1 dose of IV Diflucan.  Medically stable to be discharged today.  Assessment and Plan:  # Sepsis due to cellulitis:  Fever, tach, leukocytosis, tachypnea, elevated lactic acid, cellulitis Patient has been treated with vancomycin, Flagyl and cefepime for 3 days.  Sepsis has resolved, however, cellulitis does not seem to be improving. Patient cellulitis involving the lower abdomen, also skin fold.  A significant component of fungal infection.  Discontinued vancomycin and flagyl yesterday, added IV Diflucan.  Reexamined the patient today, lower  abdominal wall redness essentially resolved.  Will continue 3 more days of oral Diflucan.  [facility].  # Hypophosphatemia   # Hypokalemia Hyponatremia. Phosphorus and sodium level normalized.  Potassium still low today at 3.2, patient be receiving 80 mEq of oral potassium and 10 mEq of IV potassium today.  I will continue 40 mill equivalent KCl orally x 3 days.   # Diabetes mellitus type 2,    HbA1c 9.3 Patient is treated with insulin while in the hospital.  I will change to metformin and glipizide at discharge.  Patient also prescribed her a glucose monitoring kit.  She will be followed by PCP in 1 week to adjust medication dose.     # morbid obesity: BMI 59, calorie restricted diet and daily exercise advised to lose body weight        Consultants: None Procedures performed: None  Disposition: Home Diet recommendation:  Discharge Diet Orders (From admission, onward)     Start     Ordered   05/25/22 0000  Diet - low sodium heart healthy        05/25/22 1015           Cardiac diet DISCHARGE MEDICATION: Allergies as of 05/25/2022   No Known Allergies      Medication List     STOP taking these medications    prenatal multivitamin Tabs tablet       TAKE these medications    acetaminophen 325 MG tablet Commonly known as: TYLENOL Take 650 mg by mouth every 6 (six) hours as needed.   Blood Glucose Monitoring Suppl Devi 1 each by Does not apply route in the  morning, at noon, and at bedtime. May substitute to any manufacturer covered by patient's insurance.   BLOOD GLUCOSE TEST STRIPS Strp 1 each by In Vitro route in the morning, at noon, and at bedtime. May substitute to any manufacturer covered by patient's insurance.   cephALEXin 500 MG capsule Commonly known as: KEFLEX Take 1 capsule (500 mg total) by mouth 4 (four) times daily for 3 days.   fluconazole 200 MG tablet Commonly known as: DIFLUCAN Take 1 tablet (200 mg  total) by mouth daily for 3 days. Start taking on: May 26, 2022   glipiZIDE 5 MG tablet Commonly known as: GLUCOTROL Take 1 tablet (5 mg total) by mouth daily before breakfast.   ibuprofen 200 MG tablet Commonly known as: ADVIL Take 200 mg by mouth every 6 (six) hours as needed for fever or moderate pain.   Lancet Device Misc 1 each by Does not apply route in the morning, at noon, and at bedtime. May substitute to any manufacturer covered by patient's insurance.   Lancets Misc. Misc 1 each by Does not apply route in the morning, at noon, and at bedtime. May substitute to any manufacturer covered by patient's insurance.   metFORMIN 500 MG tablet Commonly known as: GLUCOPHAGE Take 1 tablet (500 mg total) by mouth 2 (two) times daily with a meal.   potassium chloride 10 MEQ tablet Commonly known as: KLOR-CON M Take 2 tablets (20 mEq total) by mouth 2 (two) times daily for 3 days.        Follow-up Information     [facility] Follow up in 1 week(s).   Specialty: Primary Care Contact information: [address] [place] [phone]               Discharge Exam: Danley Danker Weights   05/20/22 1711  Weight: (!) 141 kg   General exam: Appears calm and comfortable, morbidly obese. Respiratory system: Clear to auscultation. Respiratory effort normal. Cardiovascular system: S1 & S2 heard, RRR. No JVD, murmurs, rubs, gallops or clicks. No pedal edema. Gastrointestinal system: Abdomen is nondistended, soft and nontender. No organomegaly or masses felt. Normal bowel sounds heard. Central nervous system: Alert and oriented. No focal neurological deficits. Extremities: Symmetric 5 x 5 power. Skin: No rashes, lesions or ulcers Psychiatry: Judgement and insight appear normal. Mood & affect appropriate.    Condition at discharge: good  The results of significant diagnostics from this hospitalization (including imaging,  microbiology, ancillary and laboratory) are listed below for reference.   Imaging Studies: CT ABDOMEN PELVIS W CONTRAST  Result Date: 05/20/2022 CLINICAL DATA:  Abdominal pain. EXAM: CT ABDOMEN AND PELVIS WITH CONTRAST TECHNIQUE: Multidetector CT imaging of the abdomen and pelvis was performed using the standard protocol following bolus administration of intravenous contrast. RADIATION DOSE REDUCTION: This exam was performed according to the departmental dose-optimization program which includes automated exposure control, adjustment of the mA and/or kV according to patient size and/or use of iterative reconstruction technique. CONTRAST:  116mL OMNIPAQUE IOHEXOL 350 MG/ML SOLN COMPARISON:  None Available. FINDINGS: Lower chest: The visualized lung bases are clear. No intra-abdominal free air or free fluid. Hepatobiliary: Fatty liver. No biliary dilatation. Cholecystectomy. No retained calcified stone noted in the central CBD. Pancreas: Unremarkable. No pancreatic ductal dilatation or surrounding inflammatory changes. Spleen: Normal in size without focal abnormality. Adrenals/Urinary Tract: The adrenal glands unremarkable. The kidneys, visualized ureters, and urinary bladder appear unremarkable. Stomach/Bowel: There is no bowel obstruction or active inflammation.  The appendix is normal. Vascular/Lymphatic: The abdominal aorta and IVC are unremarkable. No portal venous gas. There is no adenopathy. Reproductive: The uterus is anteverted. An intrauterine device is noted which appears displaced inferiorly. Recommend gynecology referral for repositioning. No adnexal masses. Other: There is mild skin thickening diffuse subcutaneous edema of the anterior pelvic wall suspicious for cellulitis. Correlation with clinical exam recommended. No drainable fluid collection or abscess. Musculoskeletal: Bilateral L5 pars defects. No listhesis. No acute osseous pathology. IMPRESSION: 1. Findings suspicious for cellulitis of the  anterior pelvic wall. No drainable fluid collection or abscess. 2. No bowel obstruction. Normal appendix. 3. Fatty liver. Electronically Signed   By: Anner Crete M.D.   On: 05/20/2022 21:12    Microbiology: Results for orders placed or performed during the hospital encounter of 05/20/22  Culture, blood (single)     Status: None   Collection Time: 05/20/22  8:17 PM   Specimen: BLOOD  Result Value Ref Range Status   Specimen Description BLOOD RIGHT ASSIST CONTROL  Final   Special Requests   Final    BOTTLES DRAWN AEROBIC AND ANAEROBIC Blood Culture results may not be optimal due to an excessive volume of blood received in culture bottles   Culture   Final    NO GROWTH 5 DAYS Performed at [facility], [address], [place], [place] [address]    Report Status 05/25/2022 FINAL  Final  MRSA Next Gen by PCR, Nasal     Status: None   Collection Time: 05/21/22  4:56 PM   Specimen: Nasal Mucosa; Nasal Swab  Result Value Ref Range Status   MRSA by PCR Next Gen NOT DETECTED NOT DETECTED Final    Comment: (NOTE) The GeneXpert MRSA Assay (FDA approved for NASAL specimens only), is one component of a comprehensive MRSA colonization surveillance program. It is not intended to diagnose MRSA infection nor to guide or monitor treatment for MRSA infections. Test performance is not FDA approved in patients less than 7 years old. Performed at [facility], [address] [place], [place] [address]     Labs: CBC: Recent Labs  Lab 05/21/22 0301 05/22/22 0553 05/23/22 0427 05/24/22 0407 05/25/22 0424  WBC 14.8* 10.9* 7.8 7.5 7.5  HGB 11.6* 10.3* 10.2* 10.8* 10.7*  HCT 34.3* 30.2* 30.0* 32.9* 32.3*  MCV 84.7 83.2 83.1 85.5 84.3  PLT 200 226 232 282 081   Basic Metabolic Panel: Recent Labs  Lab 05/21/22 0301 05/22/22 0553 05/23/22 0427 05/24/22 0407 05/25/22 0424  NA 132* 132* 132* 136 134*  K 3.2* 3.2* 3.5 3.3* 3.2*  CL 99 104 104 104 102  CO2 23 21* 20*  22 24  GLUCOSE 289* 267* 224* 161* 188*  BUN 11 9 7 8 6   CREATININE 0.53 0.52 0.45 0.49 0.42*  CALCIUM 7.7* 7.6* 7.6* 7.9* 8.2*  MG 2.0 2.1 1.9 1.9  --   PHOS 1.8* 2.1* 2.2* 3.5  --    Liver Function Tests: Recent Labs  Lab 05/21/22 0301  AST 17  ALT 18  ALKPHOS 76  BILITOT 1.0  PROT 6.8  ALBUMIN 2.9*   CBG: Recent Labs  Lab 05/24/22 0806 05/24/22 1143 05/24/22 1629 05/24/22 2108 05/25/22 0810  GLUCAP 173* 165* 173* 210* 185*    Discharge time spent: greater than 30 minutes.  Signed: Sharen Hones, MD [facility] 05/25/2022

## 2022-05-25 NOTE — Progress Notes (Signed)
Erika Maxwell to be discharged Home per MD order. Discussed prescriptions and follow up appointments with the patient. Prescriptions given to patient, medication list explained in detail. Patient verbalized understanding.  Allergies as of 05/25/2022   No Known Allergies      Medication List     STOP taking these medications    prenatal multivitamin Tabs tablet       TAKE these medications    acetaminophen 325 MG tablet Commonly known as: TYLENOL Take 650 mg by mouth every 6 (six) hours as needed.   Blood Glucose Monitoring Suppl Devi 1 each by Does not apply route in the morning, at noon, and at bedtime. May substitute to any manufacturer covered by patient's insurance.   BLOOD GLUCOSE TEST STRIPS Strp 1 each by In Vitro route in the morning, at noon, and at bedtime. May substitute to any manufacturer covered by patient's insurance.   cephALEXin 500 MG capsule Commonly known as: KEFLEX Take 1 capsule (500 mg total) by mouth 4 (four) times daily for 3 days.   fluconazole 200 MG tablet Commonly known as: DIFLUCAN Take 1 tablet (200 mg total) by mouth daily for 3 days. Start taking on: May 26, 2022   glipiZIDE 5 MG tablet Commonly known as: GLUCOTROL Take 1 tablet (5 mg total) by mouth daily before breakfast.   ibuprofen 200 MG tablet Commonly known as: ADVIL Take 200 mg by mouth every 6 (six) hours as needed for fever or moderate pain.   Lancet Device Misc 1 each by Does not apply route in the morning, at noon, and at bedtime. May substitute to any manufacturer covered by patient's insurance.   Lancets Misc. Misc 1 each by Does not apply route in the morning, at noon, and at bedtime. May substitute to any manufacturer covered by patient's insurance.   metFORMIN 500 MG tablet Commonly known as: GLUCOPHAGE Take 1 tablet (500 mg total) by mouth 2 (two) times daily with a meal.   potassium chloride 10 MEQ tablet Commonly known as: KLOR-CON M Take 2  tablets (20 mEq total) by mouth 2 (two) times daily for 3 days.        Vitals:   05/25/22 0419 05/25/22 0819  BP: (!) 125/94 117/70  Pulse: 87 94  Resp: 16 16  Temp: 99.7 F (37.6 C) 98.5 F (36.9 C)  SpO2: 98% 99%    Skin clean, dry and intact without evidence of skin break down and or skin tears. IV catheter discontinued intact. Site without signs and symptoms of complications. Dressing and pressure applied. Patient denies pain at this time. No complaints noted.  An After Visit Summary was printed and given to the patient. Patient escorted via wheelchair and discharged Home home via private auto.  Fuller Mandril, RN

## 2022-05-25 NOTE — TOC Transition Note (Signed)
Transition of Care [facility]) - CM/SW Discharge Note   Patient Details  Name: Erika Maxwell MRN: [id number] Date of Birth: 1990/10/31  [facility] CM/SW Contact:  Harriet Masson, RN Phone Number:[phone] 05/25/2022, 3:04 PM   Clinical Narrative:    Pt needs education assistance for several medications. [facility] was provided to [facility] during the week however pt will be discharge today and will continue to need medications as [facility] is closed on Monday. Requested all medications go to [facility] for a pick up tomorrow for this pt. All doses of medication provided today and provider aware the pharmacy will not be available until tomorrow.  No other needs presented at this time and team has been updated.           Patient Goals and CMS Choice      Discharge Placement                         Discharge Plan and Services Additional resources added to the After Visit Summary for                                       Social Determinants of Health (SDOH) Interventions SDOH Screenings   Food Insecurity: No Food Insecurity (05/21/2022)  Housing: Low Risk  (05/21/2022)  Transportation Needs: No Transportation Needs (05/21/2022)  Utilities: Not At Risk (05/21/2022)  Depression (PHQ2-9): Low Risk  (10/04/2019)  Tobacco Use: Low Risk  (05/20/2022)     Readmission Risk Interventions     No data to display

## 2022-05-26 ENCOUNTER — Other Ambulatory Visit: Payer: Self-pay

## 2022-08-18 ENCOUNTER — Ambulatory Visit: Payer: Self-pay | Admitting: Internal Medicine

## 2022-08-18 NOTE — Progress Notes (Deleted)
HPI  Pt presents to the clinic today to establish care and for management of the conditions listed below.   Iron Deficiency Anemia: Her last H/H was 10.7/32.3, 04/2022.  She is not taking any oral Iron at this time.  She does not follow with hematology  DM2: Her last A1c was 9.3%, 04/2022.  She is taking Metformin and Glipizide as prescribed.  Her sugars range.  She checks her feet routinely.  Her last eye exam was.  Flu.  Pneumovax.  COVID.  No past medical history on file.  Current Outpatient Medications  Medication Sig Dispense Refill   acetaminophen (TYLENOL) 325 MG tablet Take 650 mg by mouth every 6 (six) hours as needed.     Blood Glucose Monitoring Suppl DEVI 1 each by Does not apply route in the morning, at noon, and at bedtime. May substitute to any manufacturer covered by patient's insurance. 1 each 0   glipiZIDE (GLUCOTROL) 5 MG tablet Take 1 tablet (5 mg total) by mouth daily before breakfast. 30 tablet 0   ibuprofen (ADVIL) 200 MG tablet Take 200 mg by mouth every 6 (six) hours as needed for fever or moderate pain.     metFORMIN (GLUCOPHAGE) 500 MG tablet Take 1 tablet (500 mg total) by mouth 2 (two) times daily with a meal. 60 tablet 0   potassium chloride (KLOR-CON M) 10 MEQ tablet Take 2 tablets (20 mEq total) by mouth 2 (two) times daily for 3 days. 12 tablet 0   No current facility-administered medications for this visit.    No Known Allergies  No family history on file.  Social History   Socioeconomic History   Marital status: Married    Spouse name: Not on file   Number of children: Not on file   Years of education: Not on file   Highest education level: Not on file  Occupational History   Not on file  Tobacco Use   Smoking status: Never   Smokeless tobacco: Never  Substance and Sexual Activity   Alcohol use: No   Drug use: No   Sexual activity: Yes    Birth control/protection: Injection, Implant, Condom  Other Topics Concern   Not on file  Social  History Narrative   Not on file   Social Determinants of Health   Financial Resource Strain: Not on file  Food Insecurity: No Food Insecurity (05/21/2022)   Hunger Vital Sign    Worried About Running Out of Food in the Last Year: Never true    Ran Out of Food in the Last Year: Never true  Transportation Needs: No Transportation Needs (05/21/2022)   PRAPARE - Administrator, Civil Service (Medical): No    Lack of Transportation (Non-Medical): No  Physical Activity: Not on file  Stress: Not on file  Social Connections: Not on file  Intimate Partner Violence: Not At Risk (10/04/2019)   Humiliation, Afraid, Rape, and Kick questionnaire    Fear of Current or Ex-Partner: No    Emotionally Abused: No    Physically Abused: No    Sexually Abused: No    ROS:  Constitutional: Denies fever, malaise, fatigue, headache or abrupt weight changes.  HEENT: Denies eye pain, eye redness, ear pain, ringing in the ears, wax buildup, runny nose, nasal congestion, bloody nose, or sore throat. Respiratory: Denies difficulty breathing, shortness of breath, cough or sputum production.   Cardiovascular: Denies chest pain, chest tightness, palpitations or swelling in the hands or feet.  Gastrointestinal: Denies abdominal  pain, bloating, constipation, diarrhea or blood in the stool.  GU: Denies frequency, urgency, pain with urination, blood in urine, odor or discharge. Musculoskeletal: Denies decrease in range of motion, difficulty with gait, muscle pain or joint pain and swelling.  Skin: Denies redness, rashes, lesions or ulcercations.  Neurological: Denies dizziness, difficulty with memory, difficulty with speech or problems with balance and coordination.  Psych: Denies anxiety, depression, SI/HI.  No other specific complaints in a complete review of systems (except as listed in HPI above).  PE:   Wt Readings from Last 3 Encounters:  05/20/22 (!) 310 lb 13.6 oz (141 kg)  10/04/19 (!) 312 lb  12.8 oz (141.9 kg)  07/22/19 (!) 309 lb 12.8 oz (140.5 kg)    General: Appears their stated age, well developed, well nourished in NAD. HEENT: Head: normal shape and size; Eyes: sclera white, no icterus, conjunctiva pink, PERRLA and EOMs intact; Ears: Tm's gray and intact, normal light reflex;Throat/Mouth: Teeth present, mucosa pink and moist, no lesions or ulcerations noted.  Neck: Neck supple, trachea midline. No masses, lumps or thyromegaly present.  Cardiovascular: Normal rate and rhythm. S1,S2 noted.  No murmur, rubs or gallops noted. No JVD or BLE edema. No carotid bruits noted. Pulmonary/Chest: Normal effort and positive vesicular breath sounds. No respiratory distress. No wheezes, rales or ronchi noted.  Abdomen: Soft and nontender. Normal bowel sounds, no bruits noted. No distention or masses noted. Liver, spleen and kidneys non palpable. Musculoskeletal: Normal range of motion. Strength 5/5 BUE/BLE. No signs of joint swelling. No difficulty with gait.  Neurological: Alert and oriented. Cranial nerves II-XII grossly intact. Coordination normal.  Psychiatric: Mood and affect normal. Behavior is normal. Judgment and thought content normal.    BMET    Component Value Date/Time   NA 134 (L) 05/25/2022 0424   NA 139 08/22/2012 2109   K 3.2 (L) 05/25/2022 0424   K 3.6 08/22/2012 2109   CL 102 05/25/2022 0424   CL 106 08/22/2012 2109   CO2 24 05/25/2022 0424   CO2 24 08/22/2012 2109   GLUCOSE 188 (H) 05/25/2022 0424   GLUCOSE 90 08/22/2012 2109   BUN 6 05/25/2022 0424   BUN 8 08/22/2012 2109   CREATININE 0.42 (L) 05/25/2022 0424   CREATININE 0.33 (L) 08/22/2012 2109   CALCIUM 8.2 (L) 05/25/2022 0424   CALCIUM 8.1 (L) 08/22/2012 2109   GFRNONAA >60 05/25/2022 0424   GFRNONAA >60 08/22/2012 2109   GFRAA >60 05/15/2016 1219   GFRAA >60 08/22/2012 2109    Lipid Panel  No results found for: "CHOL", "TRIG", "HDL", "CHOLHDL", "VLDL", "LDLCALC"  CBC    Component Value Date/Time    WBC 7.5 05/25/2022 0424   RBC 3.83 (L) 05/25/2022 0424   HGB 10.7 (L) 05/25/2022 0424   HGB 10.4 (L) 01/18/2013 0459   HCT 32.3 (L) 05/25/2022 0424   HCT 36.9 01/16/2013 1728   PLT 297 05/25/2022 0424   PLT 287 01/16/2013 1728   MCV 84.3 05/25/2022 0424   MCV 79 (L) 01/16/2013 1728   MCH 27.9 05/25/2022 0424   MCHC 33.1 05/25/2022 0424   RDW 13.0 05/25/2022 0424   RDW 16.0 (H) 01/16/2013 1728   LYMPHSABS 1.7 01/16/2013 1728   MONOABS 0.8 01/16/2013 1728   EOSABS 0.1 01/16/2013 1728   BASOSABS 0.1 01/16/2013 1728    Hgb A1C Lab Results  Component Value Date   HGBA1C 9.3 (H) 05/20/2022     Assessment and Plan:   RTC in 3 months for  your annual exam Webb Silversmith, NP
